# Patient Record
Sex: Male | Born: 1955 | Race: Black or African American | Hispanic: No | Marital: Single | Smoking: Never smoker
Health system: Southern US, Community
[De-identification: ages and names within clinical notes are randomized; demographics above are authoritative.]

## PROBLEM LIST (undated history)

## (undated) DIAGNOSIS — E785 Hyperlipidemia, unspecified: Secondary | ICD-10-CM

## (undated) DIAGNOSIS — I639 Cerebral infarction, unspecified: Secondary | ICD-10-CM

## (undated) DIAGNOSIS — I10 Essential (primary) hypertension: Secondary | ICD-10-CM

## (undated) HISTORY — PX: NO PAST SURGERIES: SHX2092

## (undated) HISTORY — PX: CORONARY ARTERY BYPASS GRAFT: SHX141

---

## 2009-06-15 ENCOUNTER — Ambulatory Visit: Payer: Self-pay | Admitting: Surgery

## 2009-06-22 ENCOUNTER — Encounter: Payer: Self-pay | Admitting: Surgery

## 2009-06-25 ENCOUNTER — Ambulatory Visit (HOSPITAL_COMMUNITY): Admission: RE | Admit: 2009-06-25 | Discharge: 2009-06-25 | Payer: Self-pay | Admitting: Interventional Radiology

## 2010-06-09 LAB — BASIC METABOLIC PANEL
BUN: 8 mg/dL (ref 6–23)
CO2: 24 mEq/L (ref 19–32)
Chloride: 106 mEq/L (ref 96–112)
Creatinine, Ser: 0.96 mg/dL (ref 0.4–1.5)
GFR calc Af Amer: >60 mL/min (ref >60)
Glucose, Bld: 92 mg/dL (ref 70–99)

## 2010-06-09 LAB — CBC
MCHC: 33.7 g/dL (ref 30.0–36.0)
WBC: 6.5 10*3/uL (ref 4.0–10.5)

## 2010-06-09 LAB — GLUCOSE, CAPILLARY: Glucose-Capillary: 99 mg/dL (ref 70–99)

## 2010-08-03 NOTE — Assessment & Plan Note (Signed)
OFFICE VISIT   KANAV, KAZMIERCZAK  DOB:  [birth year]                                       06/15/2009  CHART#:[id number]   REASON FOR VISIT:  Stroke.   HISTORY:  Patient is a 55 year old gentleman that I am seeing at the  request of Madison Hickman for evaluation of a recent stroke.  The  patient has a history of hypertension, hypercholesterolemia, and  diabetes, which are medically managed.  However, he has recently had  what he describes as multiple strokes.  He is left with slurred speech,  trouble with his nature and left-sided weakness.  He had been referred  to [facility] but has never made the trip.  He has a carotid ultrasound which  shows 1-39% bilateral carotid disease.  Transcranial Dopplers suggest  left MCA stenosis.  The MRI reveals posterior right cerebellar ischemic  infarcts.  He has undergone an echocardiogram which showed normal LV  function without mass thrombus or vegetation.  Normal ejection fraction  of 70%.   PAST MEDICAL HISTORY:  Cataracts, history of stroke and diabetes,  hypertension, hypercholesterolemia.   FAMILY HISTORY:  Negative for cardiovascular disease at an early age.   SOCIAL HISTORY:  He is disabled.  Drinks 1 quart of alcohol a week.   REVIEW OF SYSTEMS:  Positive for slurred speech and left side numbness.  All others negative, as documented in the encounter form.   PHYSICAL EXAMINATION:  Heart rate 78, blood pressure 152/80 temperature  97.6.  General:  Well-appearing in no distress.  HEENT:  Within normal  limits.  Lungs are clear.  Cardiovascular:  Regular rate and rhythm.  Abdomen:  Soft.  Musculoskeletal:  No major deformities.  Neuro:  He has  weakness on the left side of his body.  He has slurred speech, pressured  speech, and trouble with his memory.  Skin:  Without rash.   ASSESSMENT:  Status post cerebrovascular accident in the absence of  extracranial carotid disease with transcranial Dopplers suggesting  intracranial stenosis.   PLAN:  I spoke with Dr. Kerby Nora, and I am recommending that he  see him for cerebral angiography and possible treatment of his  intracranial carotid stenosis.  Dr. Corliss Skains will be contacting the  patient this week.  I stressed with the patient the importance of being  compliant with this meeting.     Jorge Ny, MD  Electronically Signed   VWB/MEDQ  D:  06/15/2009  T:  06/16/2009  Job:  [id number]   cc:   Darius Bump, M.D.  Vijaya B. Gandla  Dr. Kerby Nora

## 2011-07-07 ENCOUNTER — Telehealth (HOSPITAL_COMMUNITY): Payer: Self-pay

## 2011-07-07 NOTE — Telephone Encounter (Signed)
Called pt to set up F/U Angio with Dr. Corliss Skains.  Left VM

## 2011-07-13 ENCOUNTER — Other Ambulatory Visit (HOSPITAL_COMMUNITY): Payer: Self-pay | Admitting: Interventional Radiology

## 2011-07-13 ENCOUNTER — Telehealth (HOSPITAL_COMMUNITY): Payer: Self-pay

## 2011-07-13 DIAGNOSIS — I771 Stricture of artery: Secondary | ICD-10-CM

## 2011-07-13 NOTE — Telephone Encounter (Signed)
Called Fred Jenkins to schedule his F/U angio appt.  His mother stated that she will give him the message and have him call the office.  jlm 07-13-11

## 2011-07-27 ENCOUNTER — Other Ambulatory Visit: Payer: Self-pay | Admitting: Radiology

## 2011-07-27 ENCOUNTER — Encounter (HOSPITAL_COMMUNITY): Payer: Self-pay | Admitting: Pharmacy Technician

## 2011-08-02 ENCOUNTER — Encounter (HOSPITAL_COMMUNITY): Payer: Self-pay

## 2011-08-02 ENCOUNTER — Other Ambulatory Visit (HOSPITAL_COMMUNITY): Payer: Self-pay | Admitting: Interventional Radiology

## 2011-08-02 ENCOUNTER — Ambulatory Visit (HOSPITAL_COMMUNITY)
Admission: RE | Admit: 2011-08-02 | Discharge: 2011-08-02 | Disposition: A | Discharge status: Home / Self Care | Payer: Medicare Other | Source: Ambulatory Visit | Attending: Interventional Radiology | Admitting: Interventional Radiology

## 2011-08-02 DIAGNOSIS — I771 Stricture of artery: Secondary | ICD-10-CM

## 2011-08-02 DIAGNOSIS — I10 Essential (primary) hypertension: Secondary | ICD-10-CM | POA: Insufficient documentation

## 2011-08-02 DIAGNOSIS — I672 Cerebral atherosclerosis: Secondary | ICD-10-CM | POA: Insufficient documentation

## 2011-08-02 DIAGNOSIS — E119 Type 2 diabetes mellitus without complications: Secondary | ICD-10-CM | POA: Insufficient documentation

## 2011-08-02 HISTORY — DX: Cerebral infarction, unspecified: I63.9

## 2011-08-02 HISTORY — DX: Essential (primary) hypertension: I10

## 2011-08-02 HISTORY — DX: Hyperlipidemia, unspecified: E78.5

## 2011-08-02 LAB — BASIC METABOLIC PANEL
CO2: 27 mEq/L (ref 19–32)
Chloride: 103 mEq/L (ref 96–112)
Creatinine, Ser: 0.9 mg/dL (ref 0.50–1.35)
GFR calc Af Amer: >90 mL/min (ref >90)
Potassium: 4.4 mEq/L (ref 3.5–5.1)
Sodium: 139 mEq/L (ref 135–145)

## 2011-08-02 LAB — DIFFERENTIAL
Basophils Relative: 0 % (ref 0–1)
Eosinophils Absolute: 0.2 10*3/uL (ref 0.0–0.7)
Lymphs Abs: 1.5 10*3/uL (ref 0.7–4.0)
Monocytes Absolute: 0.3 10*3/uL (ref 0.1–1.0)
Neutrophils Relative %: 58 % (ref 43–77)

## 2011-08-02 LAB — CBC
HCT: 38.2 % — ABNORMAL LOW (ref 39.0–52.0)
Hemoglobin: 13.4 g/dL (ref 13.0–17.0)
MCH: 25.4 pg — ABNORMAL LOW (ref 26.0–34.0)
MCHC: 35.1 g/dL (ref 30.0–36.0)
MCV: 72.5 fL — ABNORMAL LOW (ref 78.0–100.0)
Platelets: 134 10*3/uL — ABNORMAL LOW (ref 150–400)
RBC: 5.27 MIL/uL (ref 4.22–5.81)
WBC: 4.8 10*3/uL (ref 4.0–10.5)

## 2011-08-02 LAB — PROTIME-INR
INR: 0.94 (ref 0.00–1.49)
Prothrombin Time: 12.8 seconds (ref 11.6–15.2)

## 2011-08-02 LAB — APTT: aPTT: 26 seconds (ref 24–37)

## 2011-08-02 MED ORDER — SODIUM CHLORIDE 0.9 % IV SOLN
INTRAVENOUS | Status: AC | PRN
  Administered 2011-08-02: 75 mL/h via INTRAVENOUS

## 2011-08-02 MED ORDER — IOHEXOL 300 MG/ML  SOLN
150.0000 mL | Freq: Once | INTRAMUSCULAR | Status: AC | PRN
  Administered 2011-08-02: 70 mL via INTRA_ARTERIAL

## 2011-08-02 MED ORDER — HYDRALAZINE HCL 20 MG/ML IJ SOLN
INTRAMUSCULAR | Status: AC | PRN
  Administered 2011-08-02 (×3): 5 mg via INTRAVENOUS

## 2011-08-02 MED ORDER — HEPARIN SOD (PORK) LOCK FLUSH 100 UNIT/ML IV SOLN
INTRAVENOUS | Status: AC | PRN
  Administered 2011-08-02 (×2): 500 [IU] via INTRAVENOUS

## 2011-08-02 MED ORDER — SODIUM CHLORIDE 0.9 % IV SOLN: Freq: Once | INTRAVENOUS | Status: DC

## 2011-08-02 MED ORDER — HYDRALAZINE HCL 20 MG/ML IJ SOLN
INTRAMUSCULAR | Status: AC
  Filled 2011-08-02: qty 1

## 2011-08-02 MED ORDER — MIDAZOLAM HCL 2 MG/2ML IJ SOLN
INTRAMUSCULAR | Status: AC
  Filled 2011-08-02: qty 2

## 2011-08-02 MED ORDER — SODIUM CHLORIDE 0.9 % IV SOLN: INTRAVENOUS | Status: AC

## 2011-08-02 MED ORDER — MIDAZOLAM HCL 5 MG/5ML IJ SOLN
INTRAMUSCULAR | Status: AC | PRN
  Administered 2011-08-02: 1 mg via INTRAVENOUS

## 2011-08-02 MED ORDER — FENTANYL CITRATE 0.05 MG/ML IJ SOLN
INTRAMUSCULAR | Status: AC | PRN
  Administered 2011-08-02: 50 ug via INTRAVENOUS

## 2011-08-02 MED ORDER — FENTANYL CITRATE 0.05 MG/ML IJ SOLN
INTRAMUSCULAR | Status: AC
  Filled 2011-08-02: qty 2

## 2011-08-02 NOTE — ED Notes (Signed)
O2 d/c'd 

## 2011-08-02 NOTE — ED Notes (Signed)
Pedal pulses good, R groin dsg clean, dry, intact; denies pain

## 2011-08-02 NOTE — ED Notes (Signed)
Sheath pulled.  Exoseal closure used.

## 2011-08-02 NOTE — Procedures (Signed)
S/P bilateral CCarteriogram,and Lt vert arteriogram Preliminary findings  1.App 60 % stenosis  Lt MCA prox,,and 25% to 50% stenosis rt MCA

## 2011-08-02 NOTE — Discharge Instructions (Signed)
Arteriogram Care After These instructions give you information on caring for yourself after your procedure. Your doctor may also give you more specific instructions. Call your doctor if you have any problems or questions after your procedure. HOME CARE  Stay in bed the rest of the day.   Keep your leg straight for at least 6 hours.   Do not lift anything heavier than 10 pounds (about a gallon of milk) for 2 days.   Do not walk a lot, run, or drive for 2 days.   Return to normal activities in 2 days or as told by your doctor.  Finding out the results of your test Ask when your test results will be ready. Make sure you get your test results. GET HELP RIGHT AWAY IF:   You have fever of 102 F (38.9 C) or higher.   You have more pain in your leg.   The leg that was cut is:   Bleeding.   Puffy (swollen) or red.   Cold.   Pale or changes color.   Weak.   Tingly or numb.  If you go to the Emergency Room, tell your nurse that you have had an arteriogram. Take this paper with you to show the nurse. MAKE SURE YOU:  Understand these instructions.   Will watch your condition.   Will get help right away if you are not doing well or get worse.  Document Released: 06/03/2008 Document Revised: 02/24/2011 Document Reviewed: 06/03/2008 [employer] Patient Information 2012 [employer]. 

## 2011-08-02 NOTE — H&P (Signed)
Chief Complaint: (L)MCA stenosis HPI: Fred Jenkins is an 56 y.o. male with a known history of (L)MCA stenosis. He also has known moderate 50% ICA stenosis bilaterally. He denies any recent amaurosis, TIA, CVA sxs and states he has been doing well. Last angiogram was 06/2009. He is on medicine for HTN and DM but states he might only take his meds 2-3x/week. He is not smoking. Plavix is not listed in his current med list, but he claims to be taking it. IT was noted on his med list in 2011.  He is scheduled today for follow up cerebral angiogram.  Past Medical History:  Past Medical History  Diagnosis Date  . Hypertension   . Diabetes mellitus   . Hyperlipidemia   . Stroke     Past Surgical History:  Past Surgical History  Procedure Date  . No past surgeries     Family History: History reviewed. No pertinent family history.  Social History:  reports that he has never smoked. He does not have any smokeless tobacco history on file. He reports that he does not drink alcohol or use illicit drugs.  Allergies: No Known Allergies  Medications: Norvasc 10 daily ASA 81mg  daily Lipitor 20mg  daily Pepcid 20mg  daily Glucophage 500mg  bid Pravachol 80mg  daily **Plavix 75mg  dfaily??**  Please HPI for pertinent positives, otherwise complete 10 system ROS negative.  Pulse 56, temperature 97.1 F (36.2 C), temperature source Oral, resp. rate 18, height 5\' 8"  (1.727 m), weight 187 lb (84.823 kg), SpO2 98.00%. Body mass index is 28.43 kg/(m^2).   General Appearance:  Alert, cooperative, no distress, appears stated age  Head:  Normocephalic, without obvious abnormality, atraumatic  ENT: Unremarkable  Neck: Supple, symmetrical, trachea midline, no adenopathy, thyroid: not enlarged, symmetric, no tenderness/mass/nodules  Lungs:   Clear to auscultation bilaterally, no w/r/r, respirations unlabored without use of accessory muscles.  Heart:  Regular rate and rhythm, S1, S2 normal, no murmur, rub or  gallop. Carotids 2+ without bruit.  Abdomen:   Soft, non-tender, non distended. Bowel sounds active all four quadrants,  no masses, no organomegaly.  Extremities: Extremities normal, atraumatic, no cyanosis or edema  Pulses: 2+ and symmetric  Neurologic: Normal affect, no gross deficits.   Results for orders placed during the hospital encounter of 08/02/11 (from the past 48 hour(s))  APTT     Status: Normal   Collection Time   08/02/11  7:45 AM      Component Value Range Comment   aPTT 26  24 - 37 (seconds)   BASIC METABOLIC PANEL     Status: Abnormal   Collection Time   08/02/11  7:45 AM      Component Value Range Comment   Sodium 139  135 - 145 (mEq/L)    Potassium 4.4  3.5 - 5.1 (mEq/L)    Chloride 103  96 - 112 (mEq/L)    CO2 27  19 - 32 (mEq/L)    Glucose, Bld 166 (*) 70 - 99 (mg/dL)    BUN 12  6 - 23 (mg/dL)    Creatinine, Ser 5.62  0.50 - 1.35 (mg/dL)    Calcium 9.3  8.4 - 10.5 (mg/dL)    GFR calc non Af Amer >90  >90 (mL/min)    GFR calc Af Amer >90  >90 (mL/min)   CBC     Status: Abnormal   Collection Time   08/02/11  7:45 AM      Component Value Range Comment   WBC 4.8  4.0 -  10.5 (K/uL)    RBC 5.27  4.22 - 5.81 (MIL/uL)    Hemoglobin 13.4  13.0 - 17.0 (g/dL)    HCT 16.1 (*) 09.6 - 52.0 (%)    MCV 72.5 (*) 78.0 - 100.0 (fL)    MCH 25.4 (*) 26.0 - 34.0 (pg)    MCHC 35.1  30.0 - 36.0 (g/dL)    RDW 04.5  40.9 - 81.1 (%)    Platelets 134 (*) 150 - 400 (K/uL)   DIFFERENTIAL     Status: Normal (Preliminary result)   Collection Time   08/02/11  7:45 AM      Component Value Range Comment   Neutrophils Relative PENDING  43 - 77 (%)    Neutro Abs PENDING  1.7 - 7.7 (K/uL)    Band Neutrophils PENDING  0 - 10 (%)    Lymphocytes Relative PENDING  12 - 46 (%)    Lymphs Abs PENDING  0.7 - 4.0 (K/uL)    Monocytes Relative PENDING  3 - 12 (%)    Monocytes Absolute PENDING  0.1 - 1.0 (K/uL)    Eosinophils Relative PENDING  0 - 5 (%)    Eosinophils Absolute PENDING  0.0 - 0.7  (K/uL)    Basophils Relative PENDING  0 - 1 (%)    Basophils Absolute PENDING  0.0 - 0.1 (K/uL)    WBC Morphology PENDING      RBC Morphology PENDING      Smear Review PENDING      nRBC PENDING  0 (/100 WBC)    Metamyelocytes Relative PENDING      Myelocytes PENDING      Promyelocytes Absolute PENDING      Blasts PENDING     PROTIME-INR     Status: Normal   Collection Time   08/02/11  7:45 AM      Component Value Range Comment   Prothrombin Time 12.8  11.6 - 15.2 (seconds)    INR 0.94  0.00 - 1.49    GLUCOSE, CAPILLARY     Status: Abnormal   Collection Time   08/02/11  8:22 AM      Component Value Range Comment   Glucose-Capillary 151 (*) 70 - 99 (mg/dL)    No results found.  Assessment/Plan (L)MCA stenosis-50% (B)ICA stenosis-50% For repeat angiogram today. Procedure discussed including risks and complications. Consent signed in chart.  Brayton El PA-C 08/02/2011, 8:43 AM

## 2011-08-02 NOTE — ED Notes (Signed)
O2 2L/Ingram started 

## 2011-08-02 NOTE — ED Notes (Signed)
Right groin and pulse stable 

## 2012-08-06 ENCOUNTER — Telehealth (HOSPITAL_COMMUNITY): Payer: Self-pay | Admitting: Interventional Radiology

## 2012-08-09 ENCOUNTER — Other Ambulatory Visit (HOSPITAL_COMMUNITY): Payer: Self-pay | Admitting: Interventional Radiology

## 2012-08-09 DIAGNOSIS — I771 Stricture of artery: Secondary | ICD-10-CM

## 2012-08-21 ENCOUNTER — Other Ambulatory Visit: Payer: Self-pay | Admitting: Radiology

## 2012-08-28 ENCOUNTER — Ambulatory Visit (HOSPITAL_COMMUNITY)
Admission: RE | Admit: 2012-08-28 | Discharge: 2012-08-28 | Disposition: A | Discharge status: Home / Self Care | Payer: Medicare Other | Source: Ambulatory Visit | Attending: Interventional Radiology | Admitting: Interventional Radiology

## 2012-08-28 ENCOUNTER — Other Ambulatory Visit (HOSPITAL_COMMUNITY): Payer: Self-pay | Admitting: Interventional Radiology

## 2012-08-28 ENCOUNTER — Encounter (HOSPITAL_COMMUNITY): Payer: Self-pay | Admitting: Pharmacy Technician

## 2012-08-28 ENCOUNTER — Encounter (HOSPITAL_COMMUNITY): Payer: Self-pay

## 2012-08-28 DIAGNOSIS — I771 Stricture of artery: Secondary | ICD-10-CM

## 2012-08-28 DIAGNOSIS — E119 Type 2 diabetes mellitus without complications: Secondary | ICD-10-CM | POA: Insufficient documentation

## 2012-08-28 DIAGNOSIS — I672 Cerebral atherosclerosis: Secondary | ICD-10-CM | POA: Insufficient documentation

## 2012-08-28 DIAGNOSIS — Z8673 Personal history of transient ischemic attack (TIA), and cerebral infarction without residual deficits: Secondary | ICD-10-CM | POA: Insufficient documentation

## 2012-08-28 DIAGNOSIS — I10 Essential (primary) hypertension: Secondary | ICD-10-CM | POA: Insufficient documentation

## 2012-08-28 DIAGNOSIS — I2510 Atherosclerotic heart disease of native coronary artery without angina pectoris: Secondary | ICD-10-CM | POA: Insufficient documentation

## 2012-08-28 DIAGNOSIS — Z7902 Long term (current) use of antithrombotics/antiplatelets: Secondary | ICD-10-CM | POA: Insufficient documentation

## 2012-08-28 DIAGNOSIS — E785 Hyperlipidemia, unspecified: Secondary | ICD-10-CM | POA: Insufficient documentation

## 2012-08-28 DIAGNOSIS — Z951 Presence of aortocoronary bypass graft: Secondary | ICD-10-CM | POA: Insufficient documentation

## 2012-08-28 DIAGNOSIS — Z7982 Long term (current) use of aspirin: Secondary | ICD-10-CM | POA: Insufficient documentation

## 2012-08-28 LAB — BASIC METABOLIC PANEL
Chloride: 101 mEq/L (ref 96–112)
GFR calc Af Amer: >90 mL/min (ref >90)
GFR calc non Af Amer: 78 mL/min — ABNORMAL LOW (ref >90)
Glucose, Bld: 188 mg/dL — ABNORMAL HIGH (ref 70–99)
Potassium: 4.1 mEq/L (ref 3.5–5.1)
Sodium: 135 mEq/L (ref 135–145)

## 2012-08-28 LAB — GLUCOSE, CAPILLARY: Glucose-Capillary: 177 mg/dL — ABNORMAL HIGH (ref 70–99)

## 2012-08-28 LAB — CBC WITH DIFFERENTIAL/PLATELET
Basophils Relative: 0 % (ref 0–1)
Eosinophils Relative: 6 % — ABNORMAL HIGH (ref 0–5)
Hemoglobin: 12.2 g/dL — ABNORMAL LOW (ref 13.0–17.0)
Lymphocytes Relative: 30 % (ref 12–46)
Neutrophils Relative %: 56 % (ref 43–77)
RBC: 5.06 MIL/uL (ref 4.22–5.81)

## 2012-08-28 LAB — PROTIME-INR
INR: 0.96 (ref 0.00–1.49)
Prothrombin Time: 12.7 seconds (ref 11.6–15.2)

## 2012-08-28 MED ORDER — FENTANYL CITRATE 0.05 MG/ML IJ SOLN
INTRAMUSCULAR | Status: AC
  Filled 2012-08-28: qty 2

## 2012-08-28 MED ORDER — SODIUM CHLORIDE 0.9 % IV SOLN: INTRAVENOUS | Status: AC

## 2012-08-28 MED ORDER — MIDAZOLAM HCL 2 MG/2ML IJ SOLN
INTRAMUSCULAR | Status: DC | PRN
  Administered 2012-08-28: 1 mg via INTRAVENOUS

## 2012-08-28 MED ORDER — IOHEXOL 300 MG/ML  SOLN
150.0000 mL | Freq: Once | INTRAMUSCULAR | Status: AC | PRN
  Administered 2012-08-28: 70 mL via INTRA_ARTERIAL

## 2012-08-28 MED ORDER — SODIUM CHLORIDE 0.9 % IV SOLN
Freq: Once | INTRAVENOUS | Status: AC
  Administered 2012-08-28: 07:00:00 via INTRAVENOUS

## 2012-08-28 MED ORDER — MIDAZOLAM HCL 2 MG/2ML IJ SOLN
INTRAMUSCULAR | Status: AC
  Filled 2012-08-28: qty 2

## 2012-08-28 MED ORDER — HEPARIN SOD (PORK) LOCK FLUSH 100 UNIT/ML IV SOLN
INTRAVENOUS | Status: DC | PRN
  Administered 2012-08-28: 500 [IU] via INTRAVENOUS

## 2012-08-28 MED ORDER — FENTANYL CITRATE 0.05 MG/ML IJ SOLN
INTRAMUSCULAR | Status: DC | PRN
  Administered 2012-08-28: 25 ug via INTRAVENOUS

## 2012-08-28 NOTE — Progress Notes (Signed)
Pt and pts mother are confused about which medications pt needs to take.  Pts mother pulled out medications in a bag that were not on pts list.  Pharmacy consult called.  Pharmacy tech came up and reviewed all of pts medications and generated a new list.  Correct list given to pt upon discharge with written instructions not to resume metformin until 08-31-12.  Pt and pts mother verbalize understanding.

## 2012-08-28 NOTE — H&P (Signed)
Fred Jenkins is an 57 y.o. male.   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 Hypertension   . Diabetes mellitus   . Hyperlipidemia   . Stroke     Past Surgical History  Procedure Laterality Date  . No past surgeries      No family history on file. Social History:  reports that he has never smoked. He does not have any smokeless tobacco history on file. He reports that he does not drink alcohol or use illicit drugs.  Allergies: No Known Allergies   (Not in a hospital admission)  Results for orders placed during the hospital encounter of 08/28/12 (from the past 48 hour(s))  GLUCOSE, CAPILLARY     Status: Abnormal   Collection Time    08/28/12  7:07 AM      Result Value Range   Glucose-Capillary 177 (*) 70 - 99 mg/dL   No results found.  Review of Systems  Constitutional: Negative for fever.  HENT: Negative for hearing loss and neck pain.   Eyes: Negative for blurred vision and double vision.  Respiratory: Negative for cough and shortness of breath.   Cardiovascular: Negative for chest pain.  Gastrointestinal: Negative for nausea, vomiting and abdominal pain.  Musculoskeletal: Negative for back pain.  Neurological: Positive for weakness. Negative for dizziness, tingling and headaches.  Psychiatric/Behavioral: Negative for hallucinations and substance abuse.    Blood pressure 146/91, pulse 70, temperature 97 F (36.1 C), temperature source Oral, resp. rate 18, height 5\' 8"  (1.727 m), weight 180 lb (81.647 kg), SpO2 97.00%. Physical Exam  Constitutional: He is oriented to person, place, and time. He appears well-developed and well-nourished.  Cardiovascular: Normal rate, regular rhythm and normal heart sounds.   No murmur  heard. Respiratory: Effort normal and breath sounds normal. He has no wheezes.  GI: Soft. Bowel sounds are normal. There is no tenderness.  Musculoskeletal: Normal range of motion.  Gait is sl unsteady per pt; does not use walker or cane  Neurological: He is alert and oriented to person, place, and time. No cranial nerve deficit.  Skin: Skin is warm.  Psychiatric: He has a normal mood and affect. His behavior is normal. Judgment and thought content normal.     Assessment/Plan CVAs Known L MCA stenosis- asymptomatic-- using ASA/Plavix Scheduled now for re check cerebral arteriogram Pt and mother aware of procedure benefits and risks and agreeable to proceed Consent signed and in chart  Latisia Hilaire A 08/28/2012, 7:26 AM

## 2012-08-28 NOTE — Procedures (Signed)
S/P bilateral common carotid arteriograms,and Lt vertebral arteriogram. Rt cfa APPROACH.  Findings . Marland Kitchen1.approx 80 %stenosis RT MCA prox.

## 2013-04-30 ENCOUNTER — Other Ambulatory Visit (HOSPITAL_COMMUNITY): Payer: Self-pay | Admitting: Interventional Radiology

## 2013-04-30 DIAGNOSIS — I639 Cerebral infarction, unspecified: Secondary | ICD-10-CM

## 2013-04-30 DIAGNOSIS — I771 Stricture of artery: Secondary | ICD-10-CM

## 2013-05-13 ENCOUNTER — Other Ambulatory Visit (HOSPITAL_COMMUNITY): Payer: Self-pay | Admitting: Interventional Radiology

## 2013-05-13 ENCOUNTER — Telehealth (HOSPITAL_COMMUNITY): Payer: Self-pay | Admitting: Interventional Radiology

## 2013-05-13 DIAGNOSIS — I639 Cerebral infarction, unspecified: Secondary | ICD-10-CM

## 2013-05-13 DIAGNOSIS — I771 Stricture of artery: Secondary | ICD-10-CM

## 2013-05-13 NOTE — Telephone Encounter (Signed)
Called pt left VM for him to call me to reschedule his MRI appt on 05/21/13 JM

## 2013-05-20 IMAGING — XA IR ANGIO INTRA EXTRACRAN SEL COM CAROTID INNOMINATE BILAT MOD SE
1 series · 13 of 24 positions shown · IV contrast (IODINE)
Comparison: Angiogram of 06/25/2009.

CLINICAL DATA: History of right-sided numbness.  Previous history
of intracranial artery stenosis.  Diabetes mellitus.  Hypertension.

BILATERAL COMMON CAROTID ARTERIOGRAMS AND LEFT VERTEBRAL ARTERY
ANGIOGRAM

[Series 300: neuro · 13 of 123 slices shown]
[im 1/123]
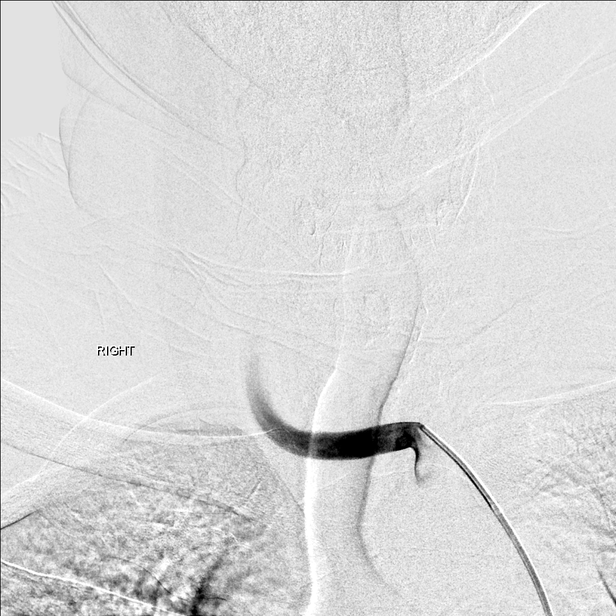
[im 11/123]
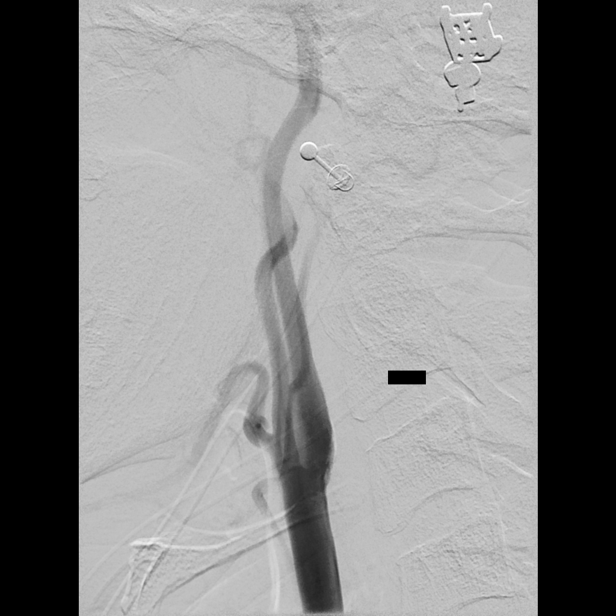
[im 22/123]
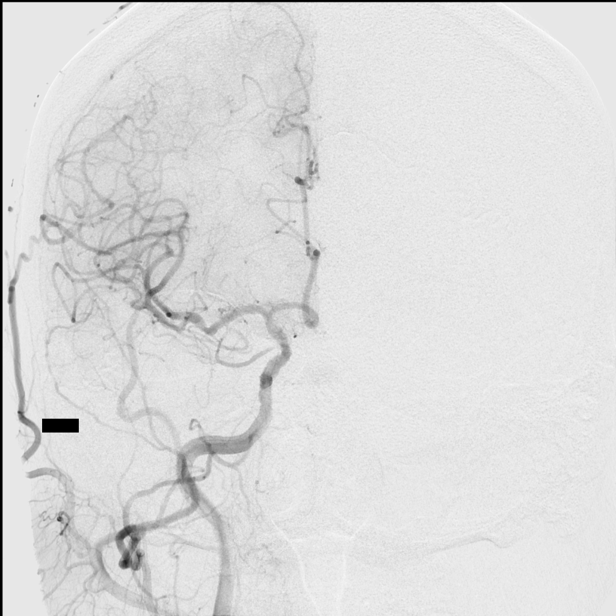
[im 32/123]
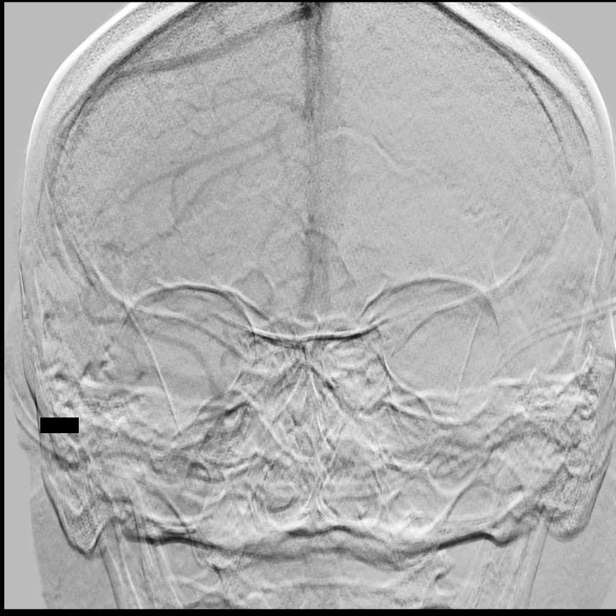
[im 43/123]
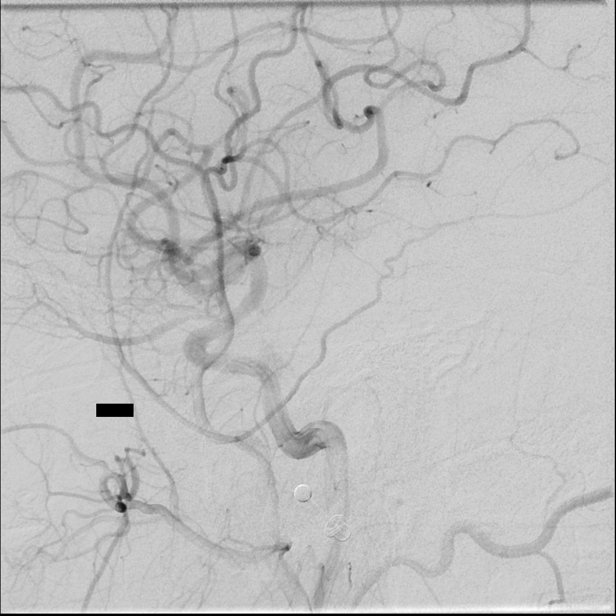
[im 54/123]
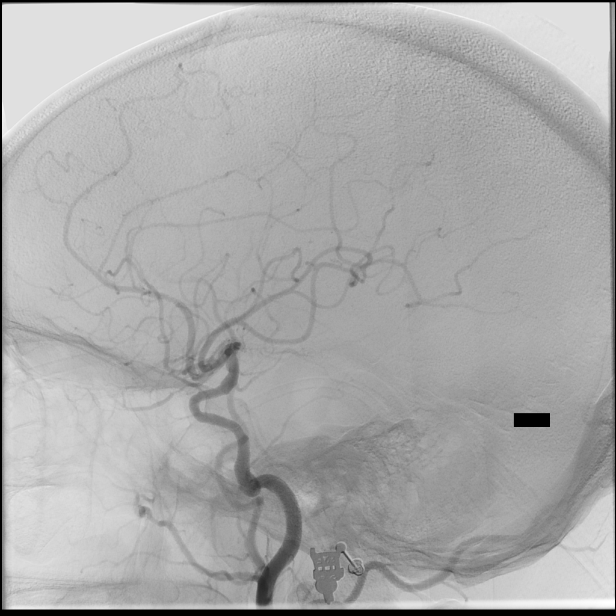
[im 64/123]
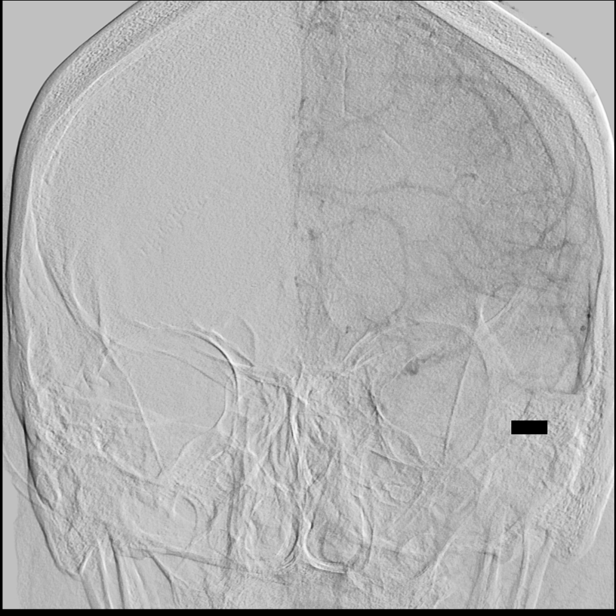
[im 69/123]
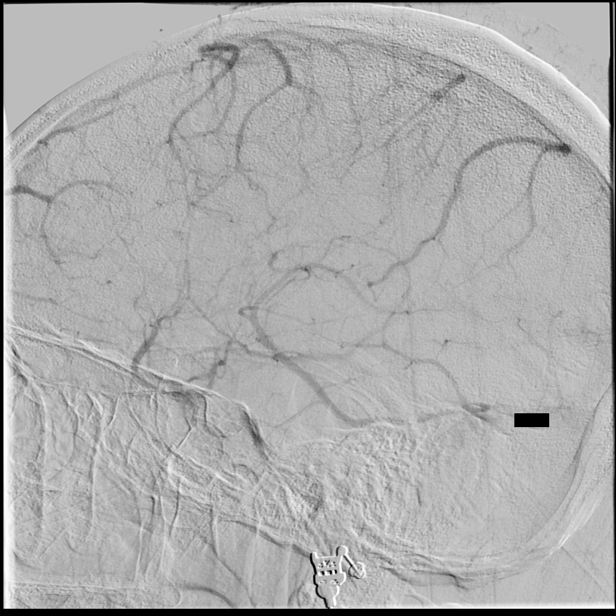
[im 80/123]
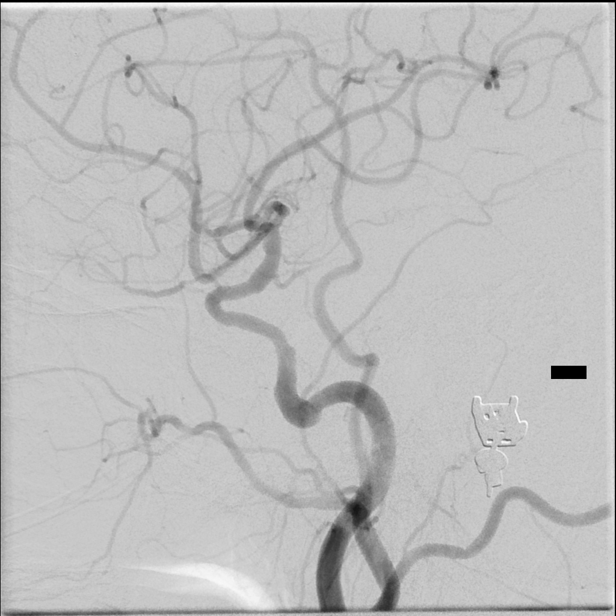
[im 91/123]
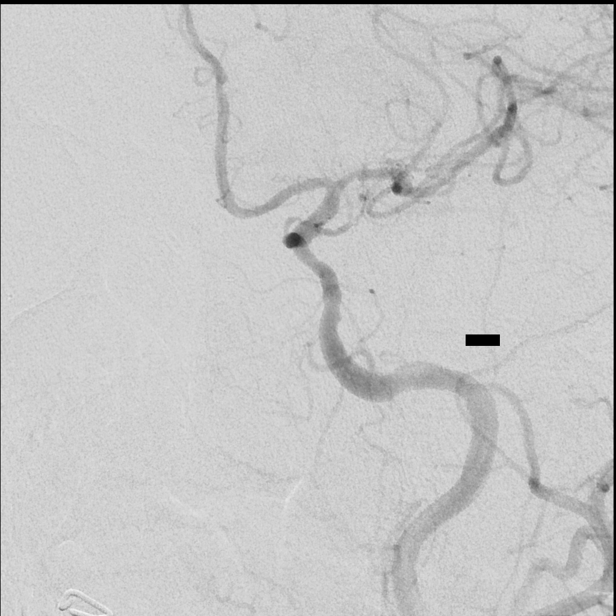
[im 101/123]
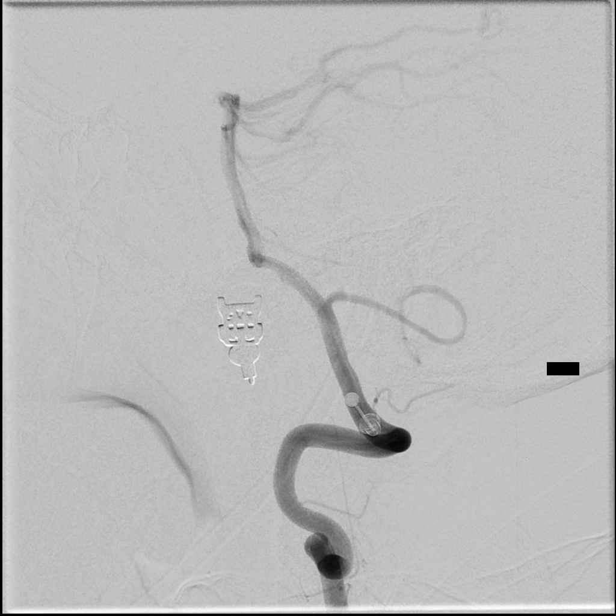
[im 112/123]
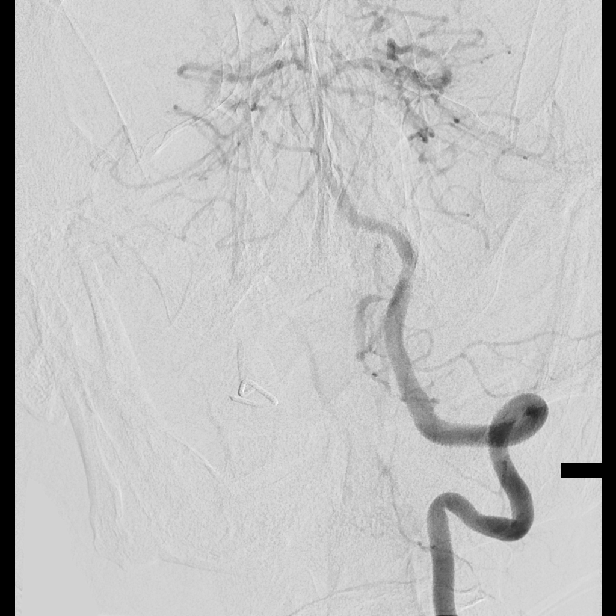
[im 123/123]
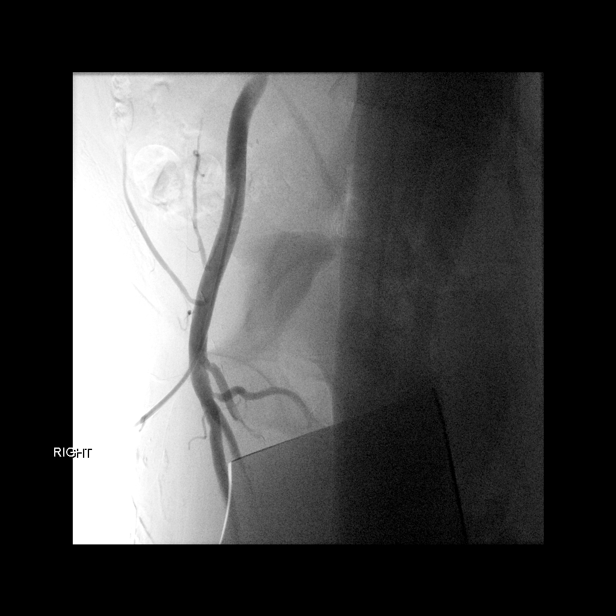

[13 of 24 positions shown; findings below may reference images not displayed]

Following a full explanation of the procedure along with the
potential associated complications, an informed witnessed consent
was obtained.

The right groin was prepped and draped in the usual sterile
fashion.  Thereafter using modified Seldinger technique,
transfemoral access into the right common femoral artery was
obtained without difficulty.  Over a 0.035-inch guidewire, a 5-
French Pinnacle sheath was inserted.  Through this and also over a
0.035-inch guidewire, a 5-French JB1 catheter was advanced to the
aortic arch region and selectively positioned in the right common
carotid artery, the left common carotid artery and the left
vertebral artery.

There were no acute complications.  The patient tolerated the
procedure well.

Medications utilized: Versed 1 mg IV.  Fentanyl 25 mcg IV.
Apresoline 15 mg IV.

Contrast: Omnipaque 300 approximately 55 ml.
FINDINGS: The right common carotid arteriogram demonstrates the right
external carotid artery and its major branches to be normal.

The right internal carotid artery at the bulb to the cranial skull
base is also normal.

The petrous and the cavernous segments are normally opacified.

There is mild to moderate circumferential narrowing of the right
internal carotid supraclinoid segment.

The right middle cerebral artery in its mid M1 segment demonstrates
50% stenosis.  The bifurcation branches otherwise are normal.

The right anterior cerebral artery opacifies normally into the
capillary and venous phases.

The left common carotid arteriogram demonstrates the left external
carotid artery and its major branches to be normal.

The left internal carotid artery at the bulb to the cranial skull
base opacifies normally.

The petrous segment is normal.

There is diffuse narrowing of the cavernous segment of the left
internal carotid artery.

The supraclinoid segment appears grossly patent.

The left middle cerebral artery again demonstrates approximately
60% to 70% stenosis.

The left MCA trifurcation branches demonstrate mild focal areas of
caliber irregularity proximally.

The left anterior cerebral artery opacifies normally into the
capillary and venous phases.

The left vertebral artery origin is normal.  The vessel opacifies
normally to the cranial skull base.

There is normal opacification of the left posterior-inferior
cerebellar artery and the left vertebrobasilar junction.

The basilar artery, the posterior cerebral arteries, the superior
cerebellar arteries and the anterior-inferior cerebellar arteries
opacify normally into capillary and venous phases.

IMPRESSION
1.  Interval suggestion of progression of the left middle cerebral
artery stenoses to 60-70% stenosis.
2.  Stable-appearing stenosis of the right middle cerebral artery
approximately 50%.
3.  Mild diffuse atherosclerotic narrowing of the left internal
carotid artery cavernous segment, and of the right internal carotid
artery supraclinoid segment, also stable.

The angiograph findings were reviewed with the patient.

He was again strongly advised to continue taking his aspirin, his
antihypertensives, and to control his blood sugars.  He will
undergo a repeat catheter angiogram in a year from today or sooner
should he develop symptoms.

## 2013-05-21 ENCOUNTER — Ambulatory Visit (HOSPITAL_COMMUNITY): Admission: RE | Admit: 2013-05-21 | Payer: Medicare Other | Source: Ambulatory Visit

## 2013-06-04 ENCOUNTER — Ambulatory Visit (HOSPITAL_COMMUNITY)
Admission: RE | Admit: 2013-06-04 | Discharge: 2013-06-04 | Disposition: A | Discharge status: Home / Self Care | Payer: Medicare Other | Source: Ambulatory Visit | Attending: Interventional Radiology | Admitting: Interventional Radiology

## 2013-06-04 DIAGNOSIS — I6529 Occlusion and stenosis of unspecified carotid artery: Secondary | ICD-10-CM | POA: Insufficient documentation

## 2013-06-04 DIAGNOSIS — I771 Stricture of artery: Secondary | ICD-10-CM

## 2013-06-04 DIAGNOSIS — G9389 Other specified disorders of brain: Secondary | ICD-10-CM | POA: Insufficient documentation

## 2013-06-04 DIAGNOSIS — I639 Cerebral infarction, unspecified: Secondary | ICD-10-CM

## 2013-06-04 LAB — CREATININE, SERUM: Creatinine, Ser: 0.64 mg/dL (ref 0.50–1.35)

## 2013-06-04 MED ORDER — GADOBENATE DIMEGLUMINE 529 MG/ML IV SOLN
20.0000 mL | Freq: Once | INTRAVENOUS | Status: AC
  Administered 2013-06-04: 17 mL via INTRAVENOUS

## 2013-06-10 ENCOUNTER — Telehealth (HOSPITAL_COMMUNITY): Payer: Self-pay | Admitting: Interventional Radiology

## 2013-06-10 NOTE — Telephone Encounter (Signed)
Called pt, left VM for him to call me to schedule angio JM

## 2013-06-26 ENCOUNTER — Telehealth (HOSPITAL_COMMUNITY): Payer: Self-pay | Admitting: Interventional Radiology

## 2013-06-26 ENCOUNTER — Other Ambulatory Visit (HOSPITAL_COMMUNITY): Payer: Self-pay | Admitting: Interventional Radiology

## 2013-06-26 DIAGNOSIS — I6529 Occlusion and stenosis of unspecified carotid artery: Secondary | ICD-10-CM

## 2013-06-26 DIAGNOSIS — I639 Cerebral infarction, unspecified: Secondary | ICD-10-CM

## 2013-06-26 NOTE — Telephone Encounter (Signed)
Called pt's mother, left VM for her to call to schedule angio for [name]

## 2013-07-12 ENCOUNTER — Telehealth (HOSPITAL_COMMUNITY): Payer: Self-pay | Admitting: Interventional Radiology

## 2013-07-12 NOTE — Telephone Encounter (Signed)
Called pt's mother, left VM for her to call to schedule pt's appt JMichaux

## 2013-07-17 ENCOUNTER — Telehealth (HOSPITAL_COMMUNITY): Payer: Self-pay | Admitting: Interventional Radiology

## 2013-07-17 NOTE — Telephone Encounter (Signed)
Called pt's mother, left VM for her to call and schedule pt's f/u cath angio JM

## 2013-07-18 ENCOUNTER — Other Ambulatory Visit: Payer: Self-pay | Admitting: Radiology

## 2013-07-18 ENCOUNTER — Encounter (HOSPITAL_COMMUNITY): Payer: Self-pay | Admitting: Pharmacy Technician

## 2013-07-23 ENCOUNTER — Encounter (HOSPITAL_COMMUNITY): Payer: Self-pay

## 2013-07-23 ENCOUNTER — Other Ambulatory Visit (HOSPITAL_COMMUNITY): Payer: Self-pay | Admitting: Interventional Radiology

## 2013-07-23 ENCOUNTER — Ambulatory Visit (HOSPITAL_COMMUNITY)
Admission: RE | Admit: 2013-07-23 | Discharge: 2013-07-23 | Disposition: A | Discharge status: Home / Self Care | Payer: Medicare Other | Source: Ambulatory Visit | Attending: Interventional Radiology | Admitting: Interventional Radiology

## 2013-07-23 DIAGNOSIS — Z7982 Long term (current) use of aspirin: Secondary | ICD-10-CM | POA: Insufficient documentation

## 2013-07-23 DIAGNOSIS — I672 Cerebral atherosclerosis: Secondary | ICD-10-CM | POA: Insufficient documentation

## 2013-07-23 DIAGNOSIS — I6529 Occlusion and stenosis of unspecified carotid artery: Secondary | ICD-10-CM

## 2013-07-23 DIAGNOSIS — E119 Type 2 diabetes mellitus without complications: Secondary | ICD-10-CM | POA: Insufficient documentation

## 2013-07-23 DIAGNOSIS — I10 Essential (primary) hypertension: Secondary | ICD-10-CM | POA: Insufficient documentation

## 2013-07-23 DIAGNOSIS — I639 Cerebral infarction, unspecified: Secondary | ICD-10-CM

## 2013-07-23 DIAGNOSIS — Z7902 Long term (current) use of antithrombotics/antiplatelets: Secondary | ICD-10-CM | POA: Insufficient documentation

## 2013-07-23 DIAGNOSIS — E785 Hyperlipidemia, unspecified: Secondary | ICD-10-CM | POA: Insufficient documentation

## 2013-07-23 DIAGNOSIS — Z8673 Personal history of transient ischemic attack (TIA), and cerebral infarction without residual deficits: Secondary | ICD-10-CM | POA: Insufficient documentation

## 2013-07-23 LAB — BASIC METABOLIC PANEL
BUN: 7 mg/dL (ref 6–23)
CALCIUM: 9.3 mg/dL (ref 8.4–10.5)
CO2: 28 mEq/L (ref 19–32)
Chloride: 103 mEq/L (ref 96–112)
Creatinine, Ser: 0.79 mg/dL (ref 0.50–1.35)
GFR calc Af Amer: >90 mL/min (ref >90)
Glucose, Bld: 100 mg/dL — ABNORMAL HIGH (ref 70–99)
POTASSIUM: 4.7 meq/L (ref 3.7–5.3)
SODIUM: 142 meq/L (ref 137–147)

## 2013-07-23 LAB — CBC WITH DIFFERENTIAL/PLATELET
Basophils Absolute: 0.1 10*3/uL (ref 0.0–0.1)
Basophils Relative: 1 % (ref 0–1)
EOS ABS: 0.4 10*3/uL (ref 0.0–0.7)
Eosinophils Relative: 7 % — ABNORMAL HIGH (ref 0–5)
HCT: 36.7 % — ABNORMAL LOW (ref 39.0–52.0)
Hemoglobin: 12.3 g/dL — ABNORMAL LOW (ref 13.0–17.0)
Lymphocytes Relative: 39 % (ref 12–46)
Lymphs Abs: 2.1 10*3/uL (ref 0.7–4.0)
MCH: 23.1 pg — ABNORMAL LOW (ref 26.0–34.0)
MCHC: 33.5 g/dL (ref 30.0–36.0)
MCV: 69 fL — ABNORMAL LOW (ref 78.0–100.0)
MONO ABS: 0.4 10*3/uL (ref 0.1–1.0)
Monocytes Relative: 7 % (ref 3–12)
NEUTROS PCT: 46 % (ref 43–77)
Neutro Abs: 2.5 10*3/uL (ref 1.7–7.7)
PLATELETS: 175 10*3/uL (ref 150–400)
RBC: 5.32 MIL/uL (ref 4.22–5.81)
RDW: 17.2 % — ABNORMAL HIGH (ref 11.5–15.5)
WBC: 5.5 10*3/uL (ref 4.0–10.5)

## 2013-07-23 LAB — PROTIME-INR
INR: 0.95 (ref 0.00–1.49)
Prothrombin Time: 12.5 seconds (ref 11.6–15.2)

## 2013-07-23 LAB — GLUCOSE, CAPILLARY: GLUCOSE-CAPILLARY: 82 mg/dL (ref 70–99)

## 2013-07-23 LAB — APTT: aPTT: 28 seconds (ref 24–37)

## 2013-07-23 MED ORDER — SODIUM CHLORIDE 0.9 % IV SOLN
INTRAVENOUS | Status: DC
Start: 2013-07-23 — End: 2013-07-24
  Administered 2013-07-23: 11:00:00 via INTRAVENOUS

## 2013-07-23 MED ORDER — IOHEXOL 300 MG/ML  SOLN
150.0000 mL | Freq: Once | INTRAMUSCULAR | Status: AC | PRN
  Administered 2013-07-23: 60 mL via INTRA_ARTERIAL

## 2013-07-23 MED ORDER — HEPARIN SOD (PORK) LOCK FLUSH 100 UNIT/ML IV SOLN
INTRAVENOUS | Status: AC | PRN
  Administered 2013-07-23: 500 [IU] via INTRAVENOUS

## 2013-07-23 MED ORDER — MIDAZOLAM HCL 2 MG/2ML IJ SOLN
INTRAMUSCULAR | Status: AC
  Filled 2013-07-23: qty 2

## 2013-07-23 MED ORDER — SODIUM CHLORIDE 0.9 % IV SOLN: INTRAVENOUS | Status: AC

## 2013-07-23 MED ORDER — FENTANYL CITRATE 0.05 MG/ML IJ SOLN
INTRAMUSCULAR | Status: AC
  Filled 2013-07-23: qty 2

## 2013-07-23 MED ORDER — MIDAZOLAM HCL 2 MG/2ML IJ SOLN
INTRAMUSCULAR | Status: AC | PRN
  Administered 2013-07-23: 1 mg via INTRAVENOUS

## 2013-07-23 MED ORDER — FENTANYL CITRATE 0.05 MG/ML IJ SOLN
INTRAMUSCULAR | Status: AC | PRN
  Administered 2013-07-23: 25 ug via INTRAVENOUS

## 2013-07-23 NOTE — Progress Notes (Signed)
Assumed care of pt from Darcel SmallingNancy Long, Charity fundraiserN. Assessment documented. Denies pain.

## 2013-07-23 NOTE — Sedation Documentation (Signed)
5 Fr exoseal closure by Chris Hines, RT 

## 2013-07-23 NOTE — Procedures (Signed)
S/P 4 vessel cerebral arteriogram RT CFA approach.Findings. 1.aprrox 70 to 75 % stenosis of Lt MCA prox . 2.Approx 65 to 70 % stenosis of RT MCA prox.

## 2013-07-23 NOTE — Sedation Documentation (Signed)
MD at bedside.  Explaining findings to pt.

## 2013-07-23 NOTE — Discharge Instructions (Signed)
Angiography, Care After Refer to this sheet in the next few weeks. These instructions provide you with information on caring for yourself after your procedure. Your health care provider may also give you more specific instructions. Your treatment has been planned according to current medical practices, but problems sometimes occur. Call your health care provider if you have any problems or questions after your procedure.  WHAT TO EXPECT AFTER THE PROCEDURE After your procedure, it is typical to have the following sensations:  Minor discomfort or tenderness and a small bump at the catheter insertion site. The bump should usually decrease in size and tenderness within 1 to 2 weeks.  Any bruising will usually fade within 2 to 4 weeks. HOME CARE INSTRUCTIONS   You may need to keep taking blood thinners if they were prescribed for you. Only take over-the-counter or prescription medicines for pain, fever, or discomfort as directed by your health care provider.  Do not apply powder or lotion to the site.  Do not sit in a bathtub, swimming pool, or whirlpool for 5 to 7 days.  You may shower 24 hours after the procedure. Remove the bandage (dressing) and gently wash the site with plain soap and water. Gently pat the site dry.  Inspect the site at least twice daily.  Limit your activity for the first 48 hours. Do not bend, squat, or lift anything over 20 lb (9 kg) or as directed by your health care provider.  Do not drive home if you are discharged the day of the procedure. Have someone else drive you. Follow instructions about when you can drive or return to work. SEEK MEDICAL CARE IF:  You get lightheaded when standing up.  You have drainage (other than a small amount of blood on the dressing).  You have chills.  You have a fever.  You have redness, warmth, swelling, or pain at the insertion site. SEEK IMMEDIATE MEDICAL CARE IF:   You develop chest pain or shortness of breath, feel faint,  or pass out.  You have bleeding, swelling larger than a walnut, or drainage from the catheter insertion site.  You develop pain, discoloration, coldness, or severe bruising in the leg or arm that held the catheter.  You develop bleeding from any other place, such as the bowels. You may see bright red blood in your urine or stools, or your stools may appear black and tarry.  You have heavy bleeding from the site. If this happens, hold pressure on the site. MAKE SURE YOU:  Understand these instructions.  Will watch your condition.  Will get help right away if you are not doing well or get worse. Document Released: 09/23/2004 Document Revised: 11/07/2012 Document Reviewed: 07/30/2012 [facility] Patient Information 2014 [place], [place].  May resume  Metformin on Thursday May 7

## 2013-07-23 NOTE — H&P (Signed)
Chief Complaint: "I am here for a follow-up study." HPI: Fred Jenkins is an 58 y.o. male with history of CVA and known cerebrovascular disease. Last cerebral arteriogram was 07/2012 with no intervention at that time as the patient was asymptomatic, he remain on aspirin and plavix daily. He did have a MRI 05/2013 and is scheduled today for a cerebral arteriogram. He denies any new neurological symptoms, he denies any tobacco use and states he does take his medications daily as instructed. He denies any chest pain, shortness of breath or palpitations. He denies any active signs of bleeding or excessive bruising. He denies any recent fever or chills. The patient denies any history of sleep apnea or chronic oxygen use. He has previously tolerated sedation and iodinated contrast without complications.   Past Medical History:  Past Medical History  Diagnosis Date  . Hypertension   . Diabetes mellitus   . Hyperlipidemia   . Stroke     Past Surgical History:  Past Surgical History  Procedure Laterality Date  . No past surgeries    . Coronary artery bypass graft      Family History: No family history on file.  Social History:  reports that he has never smoked. He does not have any smokeless tobacco history on file. He reports that he does not drink alcohol or use illicit drugs.  Allergies: No Known Allergies  Medications:   Medication List    ASK your doctor about these medications       amLODipine 10 MG tablet  Commonly known as:  NORVASC  Take 10 mg by mouth daily.     aspirin EC 81 MG tablet  Take 81 mg by mouth daily.     carvedilol 12.5 MG tablet  Commonly known as:  COREG  Take 12.5 mg by mouth 2 (two) times daily with a meal.     clopidogrel 75 MG tablet  Commonly known as:  PLAVIX  Take 75 mg by mouth daily.     famotidine 20 MG tablet  Commonly known as:  PEPCID  Take 20 mg by mouth daily.     glipiZIDE 10 MG 24 hr tablet  Commonly known as:  GLUCOTROL XL  Take  10 mg by mouth daily with breakfast.     lisinopril 10 MG tablet  Commonly known as:  PRINIVIL,ZESTRIL  Take 10 mg by mouth daily.     metFORMIN 1000 MG tablet  Commonly known as:  GLUCOPHAGE  Take 1,000 mg by mouth 2 (two) times daily with a meal.     pravastatin 80 MG tablet  Commonly known as:  PRAVACHOL  Take 80 mg by mouth at bedtime.       Please HPI for pertinent positives, otherwise complete 10 system ROS negative.  Physical Exam: BP 144/85  Pulse 57  Temp(Src) 97.5 F (36.4 C) (Oral)  Resp 20  Ht 5' 8"  (1.727 m)  Wt 180 lb (81.647 kg)  BMI 27.38 kg/m2  SpO2 100% Body mass index is 27.38 kg/(m^2).  General Appearance:  Alert, cooperative, no distress,  Head:  Normocephalic, without obvious abnormality, atraumatic  Neck: Supple, symmetrical, trachea midline  Lungs:   Clear to auscultation bilaterally, no w/r/r, respirations unlabored without use of accessory muscles.  Chest Wall:  No tenderness or deformity  Heart:  Regular rate and rhythm, S1, S2 normal, no murmur, rub or gallop.  Abdomen:   Soft, non-tender, non distended, (+) BS  Extremities: Extremities normal, atraumatic, no cyanosis or edema  Pulses: 1+ and symmetric  Neurologic: Normal affect, no gross deficits, smile symmetrical, tongue midline, no ataxia, equal strength bilaterally upper and lower extremities.    Results for orders placed during the hospital encounter of 07/23/13 (from the past 48 hour(s))  GLUCOSE, CAPILLARY     Status: None   Collection Time    07/23/13  9:48 AM      Result Value Ref Range   Glucose-Capillary 82  70 - 99 mg/dL  APTT     Status: None   Collection Time    07/23/13 10:05 AM      Result Value Ref Range   aPTT 28  24 - 37 seconds  BASIC METABOLIC PANEL     Status: Abnormal   Collection Time    07/23/13 10:05 AM      Result Value Ref Range   Sodium 142  137 - 147 mEq/L   Potassium 4.7  3.7 - 5.3 mEq/L   Chloride 103  96 - 112 mEq/L   CO2 28  19 - 32 mEq/L    Glucose, Bld 100 (*) 70 - 99 mg/dL   BUN 7  6 - 23 mg/dL   Creatinine, Ser 0.79  0.50 - 1.35 mg/dL   Calcium 9.3  8.4 - 10.5 mg/dL   GFR calc non Af Amer >90  >90 mL/min   GFR calc Af Amer >90  >90 mL/min   Comment: (NOTE)     The eGFR has been calculated using the CKD EPI equation.     This calculation has not been validated in all clinical situations.     eGFR's persistently <90 mL/min signify possible Chronic Kidney     Disease.  CBC WITH DIFFERENTIAL     Status: Abnormal (Preliminary result)   Collection Time    07/23/13 10:05 AM      Result Value Ref Range   WBC 5.5  4.0 - 10.5 K/uL   RBC 5.32  4.22 - 5.81 MIL/uL   Hemoglobin 12.3 (*) 13.0 - 17.0 g/dL   HCT 36.7 (*) 39.0 - 52.0 %   MCV 69.0 (*) 78.0 - 100.0 fL   MCH 23.1 (*) 26.0 - 34.0 pg   MCHC 33.5  30.0 - 36.0 g/dL   RDW 17.2 (*) 11.5 - 15.5 %   Platelets 175  150 - 400 K/uL   Comment: SPECIMEN CHECKED FOR CLOTS     REPEATED TO VERIFY   Neutrophils Relative % PENDING  43 - 77 %   Neutro Abs PENDING  1.7 - 7.7 K/uL   Band Neutrophils PENDING  0 - 10 %   Lymphocytes Relative PENDING  12 - 46 %   Lymphs Abs PENDING  0.7 - 4.0 K/uL   Monocytes Relative PENDING  3 - 12 %   Monocytes Absolute PENDING  0.1 - 1.0 K/uL   Eosinophils Relative PENDING  0 - 5 %   Eosinophils Absolute PENDING  0.0 - 0.7 K/uL   Basophils Relative PENDING  0 - 1 %   Basophils Absolute PENDING  0.0 - 0.1 K/uL   WBC Morphology PENDING     RBC Morphology PENDING     Smear Review PENDING     nRBC PENDING  0 /100 WBC   Metamyelocytes Relative PENDING     Myelocytes PENDING     Promyelocytes Absolute PENDING     Blasts PENDING    PROTIME-INR     Status: None   Collection Time    07/23/13 10:05 AM  Result Value Ref Range   Prothrombin Time 12.5  11.6 - 15.2 seconds   INR 0.95  0.00 - 1.49   No results found.  Assessment/Plan History of CVA with cerebrovascular disease Last cerebral arteriogram 07/2012 MRI 05/2013 Scheduled today for  cerebral arteriogram, patient with no new neurological symptoms on aspirin and plavix daily Patient has been NPO, afebrile, labs reviewed. Risks and Benefits discussed with the patient. All of the patient's questions were answered, patient is agreeable to proceed. Consent signed and in chart.   Hedy Jacob PA-C 07/23/2013, 11:28 AM

## 2014-03-20 ENCOUNTER — Other Ambulatory Visit (HOSPITAL_COMMUNITY): Payer: Self-pay | Admitting: Interventional Radiology

## 2014-03-20 DIAGNOSIS — I639 Cerebral infarction, unspecified: Secondary | ICD-10-CM

## 2014-03-20 DIAGNOSIS — I771 Stricture of artery: Secondary | ICD-10-CM

## 2014-03-28 ENCOUNTER — Other Ambulatory Visit: Payer: Self-pay | Admitting: Radiology

## 2014-03-31 ENCOUNTER — Other Ambulatory Visit: Payer: Self-pay | Admitting: Radiology

## 2014-04-01 ENCOUNTER — Encounter (HOSPITAL_COMMUNITY): Payer: Self-pay

## 2014-04-01 ENCOUNTER — Other Ambulatory Visit (HOSPITAL_COMMUNITY): Payer: Self-pay | Admitting: Interventional Radiology

## 2014-04-01 ENCOUNTER — Ambulatory Visit (HOSPITAL_COMMUNITY)
Admission: RE | Admit: 2014-04-01 | Discharge: 2014-04-01 | Disposition: A | Discharge status: Home / Self Care | Payer: Medicare Other | Source: Ambulatory Visit | Attending: Interventional Radiology | Admitting: Interventional Radiology

## 2014-04-01 DIAGNOSIS — I639 Cerebral infarction, unspecified: Secondary | ICD-10-CM

## 2014-04-01 DIAGNOSIS — Z7902 Long term (current) use of antithrombotics/antiplatelets: Secondary | ICD-10-CM | POA: Diagnosis not present

## 2014-04-01 DIAGNOSIS — E119 Type 2 diabetes mellitus without complications: Secondary | ICD-10-CM | POA: Insufficient documentation

## 2014-04-01 DIAGNOSIS — I10 Essential (primary) hypertension: Secondary | ICD-10-CM | POA: Diagnosis not present

## 2014-04-01 DIAGNOSIS — I771 Stricture of artery: Secondary | ICD-10-CM

## 2014-04-01 DIAGNOSIS — F1290 Cannabis use, unspecified, uncomplicated: Secondary | ICD-10-CM | POA: Insufficient documentation

## 2014-04-01 DIAGNOSIS — Z8673 Personal history of transient ischemic attack (TIA), and cerebral infarction without residual deficits: Secondary | ICD-10-CM | POA: Diagnosis not present

## 2014-04-01 DIAGNOSIS — I672 Cerebral atherosclerosis: Secondary | ICD-10-CM | POA: Diagnosis present

## 2014-04-01 DIAGNOSIS — Z79899 Other long term (current) drug therapy: Secondary | ICD-10-CM | POA: Diagnosis not present

## 2014-04-01 DIAGNOSIS — Z7982 Long term (current) use of aspirin: Secondary | ICD-10-CM | POA: Diagnosis not present

## 2014-04-01 DIAGNOSIS — E785 Hyperlipidemia, unspecified: Secondary | ICD-10-CM | POA: Diagnosis not present

## 2014-04-01 DIAGNOSIS — I6603 Occlusion and stenosis of bilateral middle cerebral arteries: Secondary | ICD-10-CM | POA: Insufficient documentation

## 2014-04-01 LAB — BASIC METABOLIC PANEL
Anion gap: 9 (ref 5–15)
BUN: 10 mg/dL (ref 6–23)
CALCIUM: 9 mg/dL (ref 8.4–10.5)
CO2: 25 mmol/L (ref 19–32)
CREATININE: 0.82 mg/dL (ref 0.50–1.35)
Chloride: 105 mEq/L (ref 96–112)
GFR calc Af Amer: >90 mL/min (ref >90)
GLUCOSE: 181 mg/dL — AB (ref 70–99)
Potassium: 4 mmol/L (ref 3.5–5.1)
Sodium: 139 mmol/L (ref 135–145)

## 2014-04-01 LAB — CBC WITH DIFFERENTIAL/PLATELET
BASOS ABS: 0.1 10*3/uL (ref 0.0–0.1)
BASOS PCT: 1 % (ref 0–1)
EOS ABS: 0.2 10*3/uL (ref 0.0–0.7)
Eosinophils Relative: 4 % (ref 0–5)
HCT: 37.2 % — ABNORMAL LOW (ref 39.0–52.0)
HEMOGLOBIN: 12.4 g/dL — AB (ref 13.0–17.0)
LYMPHS PCT: 34 % (ref 12–46)
Lymphs Abs: 1.8 10*3/uL (ref 0.7–4.0)
MCH: 24.9 pg — ABNORMAL LOW (ref 26.0–34.0)
MCHC: 33.3 g/dL (ref 30.0–36.0)
MCV: 74.8 fL — ABNORMAL LOW (ref 78.0–100.0)
MONOS PCT: 6 % (ref 3–12)
Monocytes Absolute: 0.3 10*3/uL (ref 0.1–1.0)
NEUTROS ABS: 2.8 10*3/uL (ref 1.7–7.7)
NEUTROS PCT: 55 % (ref 43–77)
Platelets: 138 10*3/uL — ABNORMAL LOW (ref 150–400)
RBC: 4.97 MIL/uL (ref 4.22–5.81)
RDW: 14.5 % (ref 11.5–15.5)
WBC: 5.2 10*3/uL (ref 4.0–10.5)

## 2014-04-01 LAB — APTT: aPTT: 27 seconds (ref 24–37)

## 2014-04-01 LAB — PROTIME-INR
INR: 0.97 (ref 0.00–1.49)
Prothrombin Time: 13 seconds (ref 11.6–15.2)

## 2014-04-01 LAB — GLUCOSE, CAPILLARY: Glucose-Capillary: 149 mg/dL — ABNORMAL HIGH (ref 70–99)

## 2014-04-01 MED ORDER — LIDOCAINE HCL 1 % IJ SOLN
INTRAMUSCULAR | Status: AC
  Filled 2014-04-01: qty 20

## 2014-04-01 MED ORDER — FENTANYL CITRATE 0.05 MG/ML IJ SOLN
INTRAMUSCULAR | Status: AC | PRN
  Administered 2014-04-01: 25 ug via INTRAVENOUS

## 2014-04-01 MED ORDER — MIDAZOLAM HCL 2 MG/2ML IJ SOLN
INTRAMUSCULAR | Status: AC
  Filled 2014-04-01: qty 4

## 2014-04-01 MED ORDER — IOHEXOL 300 MG/ML  SOLN
150.0000 mL | Freq: Once | INTRAMUSCULAR | Status: AC | PRN
  Administered 2014-04-01: 70 mL via INTRA_ARTERIAL

## 2014-04-01 MED ORDER — HEPARIN SODIUM (PORCINE) 1000 UNIT/ML IJ SOLN
INTRAMUSCULAR | Status: AC
  Filled 2014-04-01: qty 1

## 2014-04-01 MED ORDER — FENTANYL CITRATE 0.05 MG/ML IJ SOLN
INTRAMUSCULAR | Status: AC
  Filled 2014-04-01: qty 2

## 2014-04-01 MED ORDER — MIDAZOLAM HCL 2 MG/2ML IJ SOLN
INTRAMUSCULAR | Status: AC | PRN
  Administered 2014-04-01: 1 mg via INTRAVENOUS

## 2014-04-01 MED ORDER — SODIUM CHLORIDE 0.9 % IV SOLN
INTRAVENOUS | Status: AC
  Administered 2014-04-01: 11:00:00 via INTRAVENOUS

## 2014-04-01 MED ORDER — SODIUM CHLORIDE 0.9 % IV SOLN
Freq: Once | INTRAVENOUS | Status: AC
  Administered 2014-04-01: 09:00:00 via INTRAVENOUS

## 2014-04-01 MED ORDER — HEPARIN SODIUM (PORCINE) 1000 UNIT/ML IJ SOLN
INTRAMUSCULAR | Status: AC | PRN
  Administered 2014-04-01: 1000 [IU] via INTRAVENOUS

## 2014-04-01 NOTE — Sedation Documentation (Signed)
Leandra KernBecky Frye, Radiology tech holding pressure to rt groin following exoseal insertion. Pt tolerating well.

## 2014-04-01 NOTE — H&P (Signed)
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 male  Pt known to NIR Has had at least 3 CVA Initially seen for cerebral arteriogram 06/2009 Revealed B MCA stenosis Initiated ASA/Plavix and followed with arteriograms Most recent cerebral arteriogram 07/2013 does show continued B MCA stenosis now with B PCA stenosis Pt has remained asymptomatic on ASA/Plavix Denies gait problems; vision or speech issues; no numbness or tingling Now for recheck arteriogram Continues to use marijuana daily Last use 03/31/14 pm   Past Medical History  Diagnosis Date  . Hypertension   . Diabetes mellitus   . Hyperlipidemia   . Stroke     Past Surgical History  Procedure Laterality Date  . No past surgeries    . Coronary artery bypass graft      Allergies: Review of patient's allergies indicates no known allergies.  Medications: Prior to Admission medications   Medication Sig Start Date End Date Taking? Authorizing Provider  amLODipine (NORVASC) 10 MG tablet Take 10 mg by mouth daily.   Yes Historical Provider, MD  aspirin EC 81 MG tablet Take 81 mg by mouth daily.   Yes Historical Provider, MD  carvedilol (COREG) 12.5 MG tablet Take 12.5 mg by mouth 2 (two) times daily with a meal.   Yes Historical Provider, MD  clopidogrel (PLAVIX) 75 MG tablet Take 75 mg by mouth daily.   Yes Historical Provider, MD  famotidine (PEPCID) 20 MG tablet Take 20 mg by mouth daily.   Yes Historical Provider, MD  glipiZIDE (GLUCOTROL XL) 5 MG 24 hr tablet Take 5 mg by mouth daily with breakfast.  03/04/14  Yes Historical Provider, MD  lisinopril (PRINIVIL,ZESTRIL) 10 MG tablet Take 10 mg by mouth daily.   Yes Historical Provider, MD  metFORMIN (GLUCOPHAGE) 1000 MG tablet Take 1,000 mg by mouth 2 (two) times daily with a meal.   Yes Historical Provider, MD  pravastatin  (PRAVACHOL) 80 MG tablet Take 80 mg by mouth at bedtime.   Yes Historical Provider, MD    History reviewed. No pertinent family history.  History   Social History  . Marital Status: Single    Spouse Name: N/A    Number of Children: N/A  . Years of Education: N/A   Social History Main Topics  . Smoking status: Never Smoker   . Smokeless tobacco: None  . Alcohol Use: No  . Drug Use: 7.00 per week    Special: Marijuana  . Sexual Activity: Not Currently   Other Topics Concern  . None   Social History Narrative    Review of Systems: A 12 point ROS discussed and pertinent positives are indicated in the HPI above.  All other systems are negative.  Review of Systems  Constitutional: Negative for activity change, appetite change, fatigue and unexpected weight change.  HENT: Positive for dental problem. Negative for tinnitus and trouble swallowing.   Respiratory: Negative for cough and shortness of breath.   Cardiovascular: Negative for chest pain.  Gastrointestinal: Negative for abdominal pain.  Genitourinary: Negative for difficulty urinating.  Musculoskeletal: Negative for back pain.  Neurological: Negative for dizziness, tremors, seizures, syncope, facial asymmetry, speech difficulty, weakness, light-headedness, numbness and headaches.  Psychiatric/Behavioral: Negative for confusion.    Vital Signs: BP 155/94 mmHg  Pulse 66  Temp(Src) 98.3 F (36.8 C) (Oral)  Resp 18  Ht 5\' 8"  (1.727 m)  Wt 81.647  kg (180 lb)  BMI 27.38 kg/m2  SpO2 100%  Physical Exam  Constitutional: He is oriented to person, place, and time.  Cardiovascular: Normal rate and regular rhythm.   No murmur heard. Pulmonary/Chest: Effort normal and breath sounds normal. He has no wheezes.  Abdominal: Soft. Bowel sounds are normal. There is no tenderness.  Musculoskeletal: Normal range of motion.  Neurological: He is alert and oriented to person, place, and time.  Skin: Skin is warm and dry.    Psychiatric: He has a normal mood and affect. His behavior is normal. Judgment and thought content normal.  Nursing note and vitals reviewed.   Imaging: No results found.  Labs:  CBC:  Recent Labs  07/23/13 1005  WBC 5.5  HGB 12.3*  HCT 36.7*  PLT 175    COAGS:  Recent Labs  07/23/13 1005  INR 0.95  APTT 28    BMP:  Recent Labs  06/04/13 0937 07/23/13 1005  NA  --  142  K  --  4.7  CL  --  103  CO2  --  28  GLUCOSE  --  100*  BUN  --  7  CALCIUM  --  9.3  CREATININE 0.64 0.79  GFRNONAA >90 >90  GFRAA >90 >90    LIVER FUNCTION TESTS: No results for input(s): BILITOT, AST, ALT, ALKPHOS, PROT, ALBUMIN in the last 8760 hours.  TUMOR MARKERS: No results for input(s): AFPTM, CEA, CA199, CHROMGRNA in the last 8760 hours.  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  I greatly enjoyed meeting Fred Jenkins and look forward to participating in their care.    I spent a total of 20 minutes face to face in clinical consultation, greater than 50% of which was counseling/coordinating care for cerebral arteriogram  Signed: TURPIN,PAMELA A 04/01/2014, 9:22 AM

## 2014-04-01 NOTE — Discharge Instructions (Signed)
1.Do not take metformin until Jan14 th,thursday    Arteriogram Care After These instructions give you information on caring for yourself after your procedure. Your doctor may also give you more specific instructions. Call your doctor if you have any problems or questions after your procedure. HOME CARE  Keep your leg straight for at least 6 hours.  Do not bathe, swim, or use a hot tub until directed by your doctor. You can shower.  Do not lift anything heavier than 10 pounds (about a gallon of milk) for 2 days.  Do not walk a lot, run, or drive for 2 days.  Return to normal activities in 2 days or as told by your doctor. Finding out the results of your test Ask when your test results will be ready. Make sure you get your test results. GET HELP RIGHT AWAY IF:   You have fever.  You have more pain in your leg.  The leg that was cut is:  Bleeding.  Puffy (swollen) or red.  Cold.  Pale or changes color.  Weak.  Tingly or numb. If you go to the Emergency Room, tell your nurse that you have had an arteriogram. Take this paper with you to show the nurse. MAKE SURE YOU:  Understand these instructions.  Will watch your condition.  Will get help right away if you are not doing well or get worse. Document Released: 06/03/2008 Document Revised: 03/12/2013 Document Reviewed: 06/03/2008 [facility] Patient Information 2015 [employer], [place]. This information is not intended to replace advice given to you by your health care provider. Make sure you discuss any questions you have with your health care provider.

## 2014-04-01 NOTE — Sedation Documentation (Signed)
Patient is resting comfortably. 

## 2014-04-01 NOTE — Sedation Documentation (Signed)
Dr. Corliss Skainseveshwar in room s/w pt and mother about results of test. Questions answered. Will see pt again in 6mos per MD.  Family v/u and agreed.

## 2014-04-01 NOTE — Sedation Documentation (Addendum)
Pt following commands to hold breathe when requested.

## 2014-04-01 NOTE — Sedation Documentation (Signed)
Dr. Corliss Skainseveshwar performed neuro assessment.

## 2014-04-01 NOTE — Procedures (Signed)
S/P 4 vessel cerebral arteriogram  RT CFA approach . Findings. 1.Approx 70 % and 50 % tandenm stenosis of Lt MCA prox. 2.Approx 50 % stenosis of RT MCA inferior division origin. 3.Scattered mild to mod arteriosclerotic intracranial changes

## 2014-04-01 NOTE — Sedation Documentation (Signed)
Patient denies pain and is resting comfortably.  

## 2014-04-01 NOTE — Sedation Documentation (Signed)
Dr Corliss Skainseveshwar in to s/w pt/family. Questions answered.

## 2015-05-11 IMAGING — XA IR VERTEBRAL  NON-SELECT UNILAT  RIGHT(MS)
2 of 3 series · 12 of 24 positions shown · IV contrast (IODINE)
Comparison: none

CLINICAL DATA: Known history of bilateral middle cerebral arteries
stenoses. Recent new ischemic infarct in the left thalamus.

[Series 9: carotid 1 · 1 of 1 slices shown]
[im 1/1]
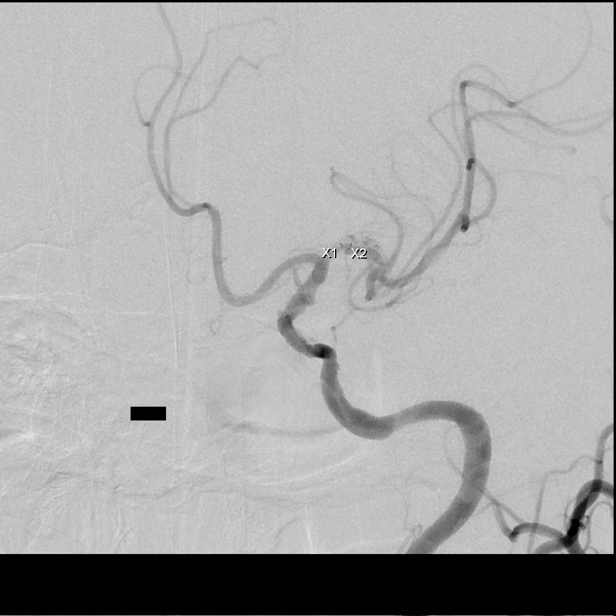

[Series 300: neuro · 11 of 155 slices shown]
[im 8/155]
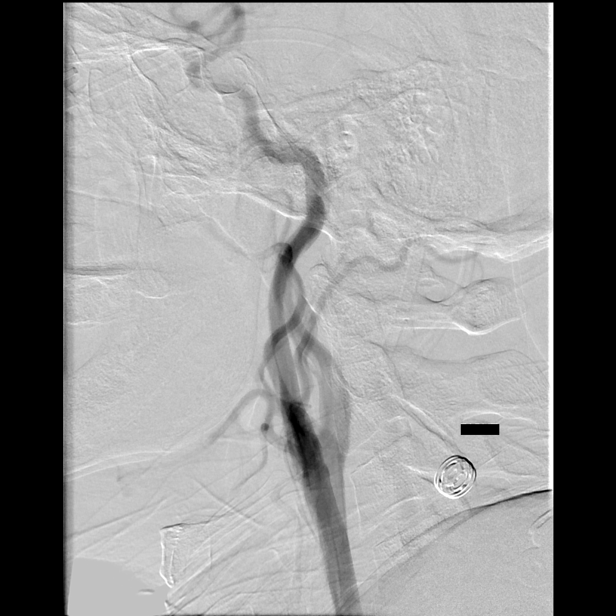
[im 23/155]
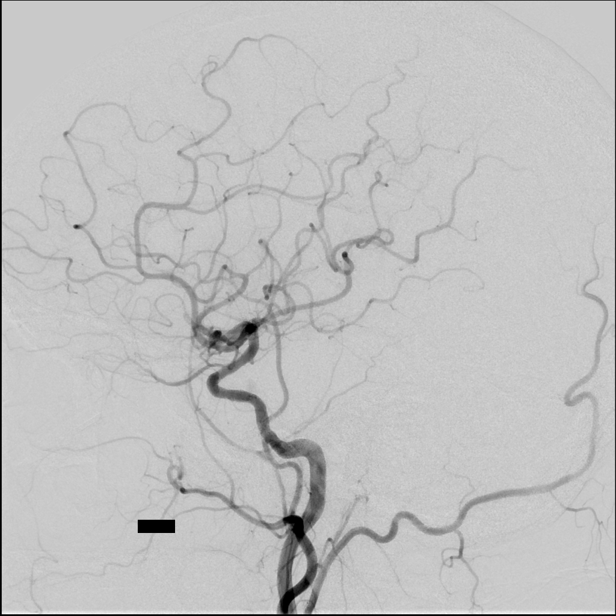
[im 37/155]
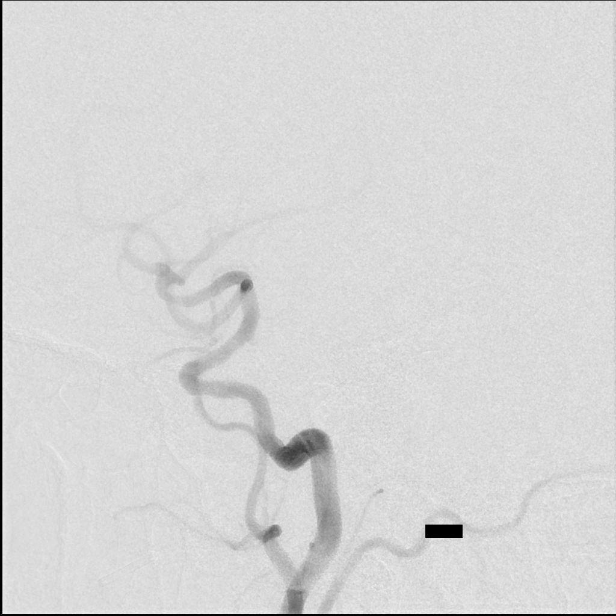
[im 52/155]
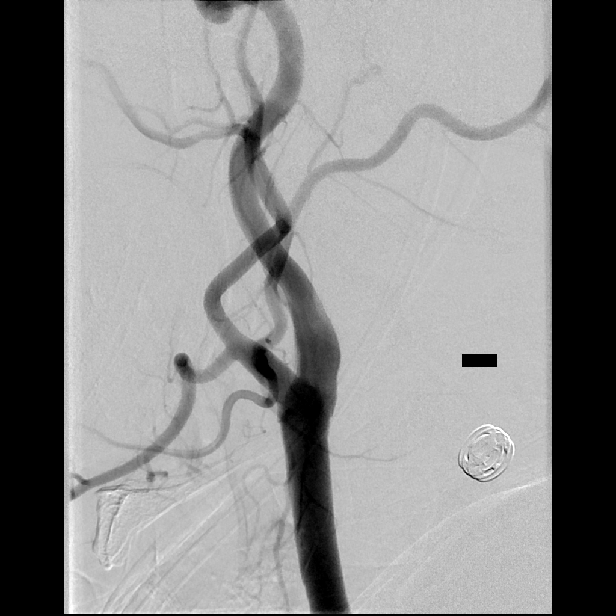
[im 67/155]
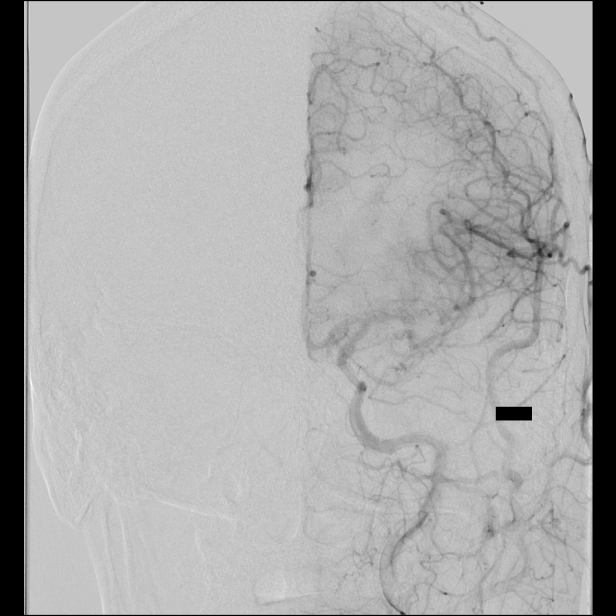
[im 81/155]
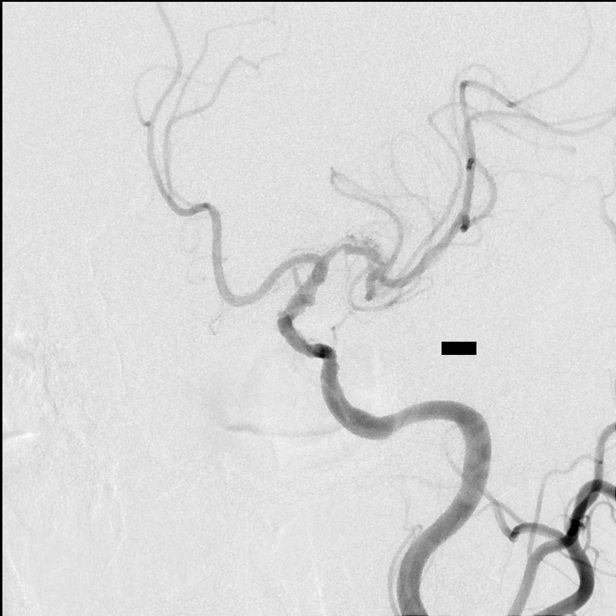
[im 96/155]
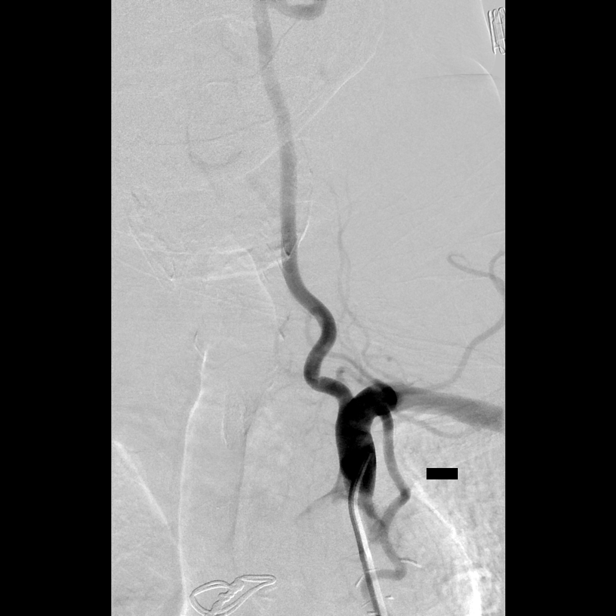
[im 111/155]
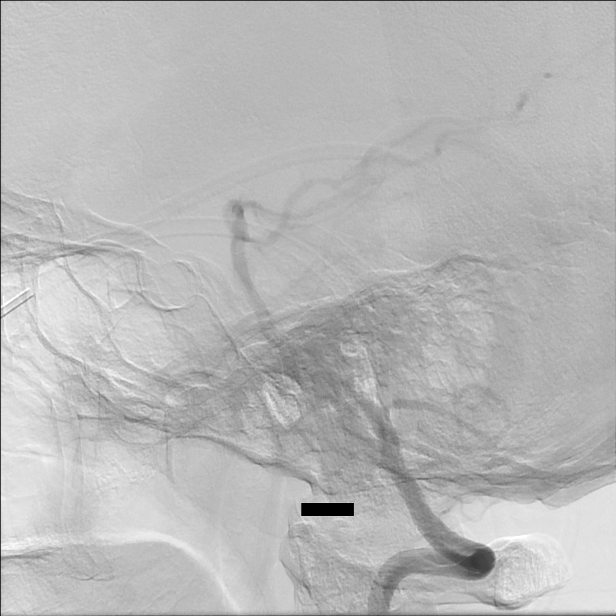
[im 125/155]
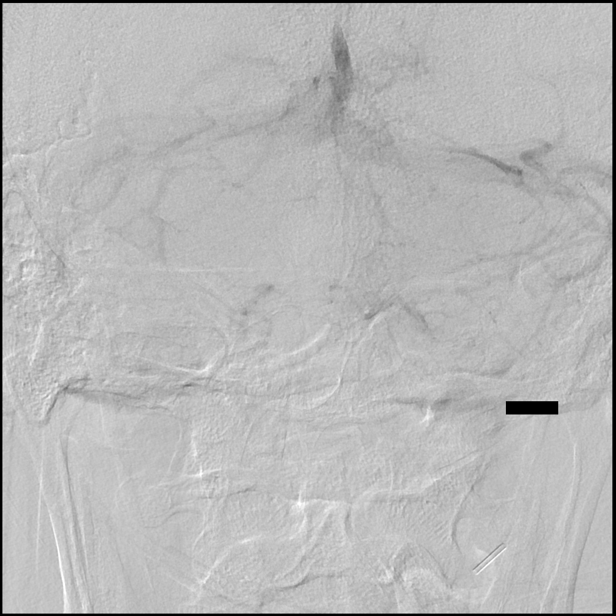
[im 140/155]
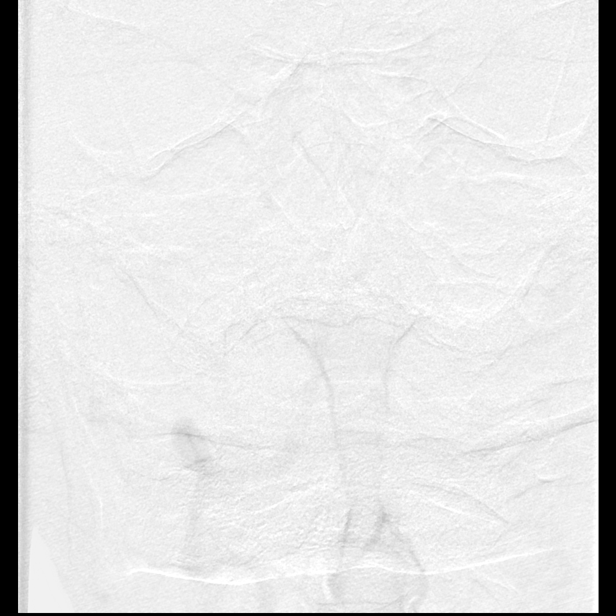
[im 155/155]
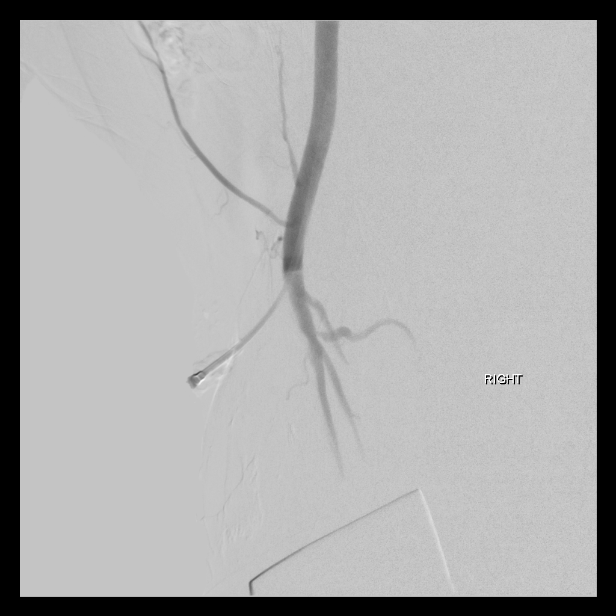

[12 of 24 positions shown; findings below may reference images not displayed]

EXAM:
BILATERAL COMMON CAROTID AND INNOMINATE ANGIOGRAPHY AND BILATERAL
VERTEBRAL ARTERY ANGIOGRAMS:

ANESTHESIA/SEDATION:
Conscious sedation.

MEDICATIONS:
Versed 1 mg IV. Fentanyl 25 mcg IV.

CONTRAST:  60mL OMNIPAQUE IOHEXOL 300 MG/ML SOLN

PROCEDURE:
Following a full explanation of the procedure along with the
potential associated complications, an informed witnessed consent
was obtained. The right groin was prepped and draped in the usual
sterile fashion. Thereafter, using modified Seldinger technique,
transfemoral access into the right common femoral artery was
obtained without difficulty. Over a 0.035 inch guidewire a 5 French
Pinnacle sheath was inserted. Through this, and also over a
inch guidewire a 5 French JB1 catheter was advanced to the aortic
arch region and selectively positioned in the right common carotid
artery, the left common carotid artery, the left vertebral artery,
and the right subclavian artery.

The patient tolerated the procedure well.

COMPLICATIONS:
None immediate
FINDINGS: The right common carotid arteriogram demonstrates the right external
carotid artery and its major branches to be widely patent.

The right internal carotid artery at the bulb to the cranial skull
base opacifies normally.

The petrous, the cavernous and the supraclinoid segments are widely
patent.

The right middle cerebral artery demonstrates mild narrowing at its
origin. Vessel demonstrates approximately 70 percent stenoses of the
distal right M1 segment. Branches distal to this appear normally
opacified into the capillary and the venous phases. The right
anterior cerebral artery opacifies normally into the capillary and
the venous phases.

A few scattered mild focal irregular caliber changes suggest
intracranial arteriosclerosis.

The left common carotid arteriogram demonstrates the left external
carotid artery and its major branches to be normal.

The left internal carotid artery at the bulb to the cranial skull
base opacifies normally.

There is mild fusiform prominence of the distal petrous segment.

Mild diffuse narrowing is seen of the cavernous segment. The
supraclinoid segment is widely patent.

The left middle cerebral artery has a 75% to 80% narrowing in the M1
segment.

Distal to this, the trifurcation branches opacify normally.

Left anterior cerebral artery opacifies normally into the capillary
and the venous phases.

A few scattered focal areas of mild caliber irregularity are seen in
the pericallosal and the callosal marginal branches suggestive of
intracranial arteriosclerosis.

The left vertebral artery origin is normal. The vessel opacifies
normally to the cranial skull base. Opacification of the left
vertebrobasilar junction is normal.

Patency of the left posterior-inferior cerebellar artery is seen
with associated focal areas of mild caliber irregularity and
narrowing most consistent with intracranial arteriosclerosis.

Basilar artery, superior cerebellarl arteries and the left anterior
inferior cerebellar artery opacify normally into the capillary and
the venous phases. There is modest narrowing at the origin of the
right anterior inferior cerebellar artery. Both posterior cerebral
arteries demonstrate approximately 50% narrowing in the proximal P1
segments with flow noted distally into vessels with mild caliber
irregularity again suggestive of intracranial arteriosclerosis. The
right subclavian artery has an aberrant origin developmental
variation.

The origin of the right vertebral artery is normal. The vessel
opacifies normally through the cranial skull base.
IMPRESSION: Approximately 75-80% narrowing of the left middle cerebral artery M1
segment.

Approximately 65-70% stenoses of the right middle cerebral artery M1
segment. Both of these lesions have progressed compared to the
previous arteriogram.

Stable, approximately 50% narrowing of the posterior cerebral
arteries bilaterally in the P1 segments.

The angiographic findings were reviewed with the patient. The
patient remains essentially asymptomatic at this time despite the
angiographic progression of the middle cerebral artery disease
bilaterally. It's best to continue with aspirin and Plavix in
addition to controlling his diabetes and high blood pressure. He was
also strongly advised to stop smoking. Follow-up arteriogram will be
undertaken in 6 months time given the above findings, or sooner
should the patient become symptomatic.

## 2016-01-18 IMAGING — XA IR VERTEBRAL  SELECTIVE  UNILAT LEFT (MS)
1 series · 12 of 24 positions shown · IV contrast (IODINE)
Comparison: none

CLINICAL DATA: Patient with a intermittent headaches. Previous
history of left-sided cerebral hemispheric ischemic symptoms.
Bilateral middle cerebral artery stenosis.

[Series 300: neuro · 12 of 139 slices shown]
[im 7/139]
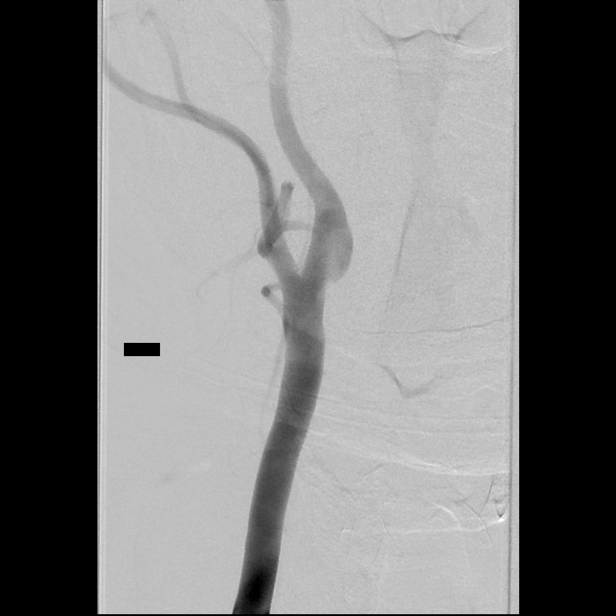
[im 19/139]
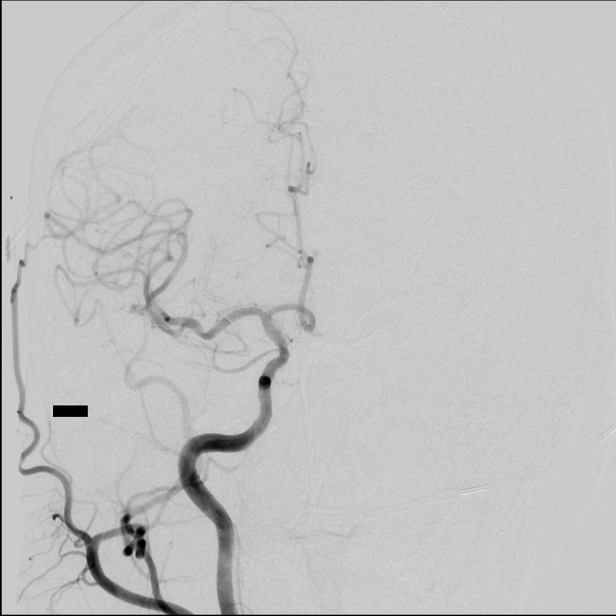
[im 31/139]
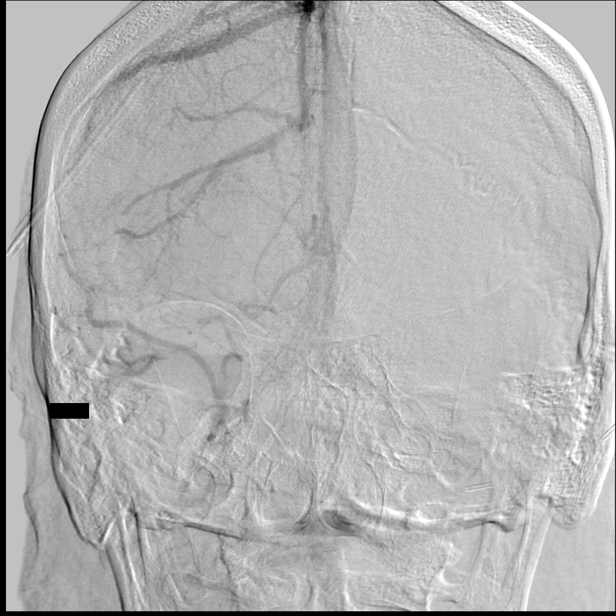
[im 43/139]
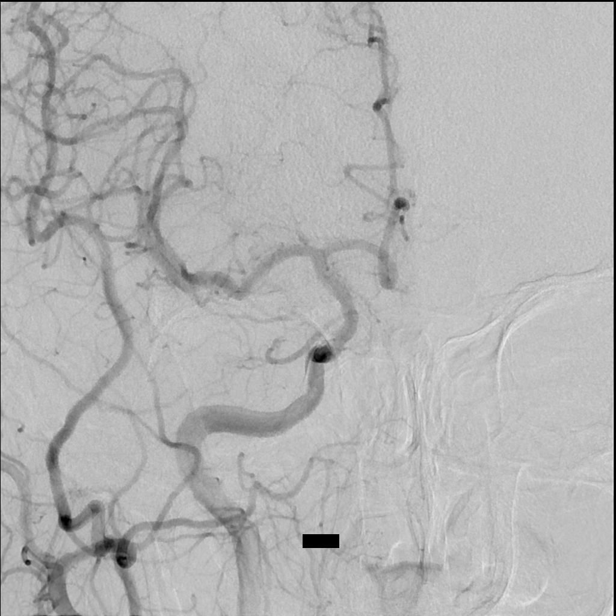
[im 55/139]
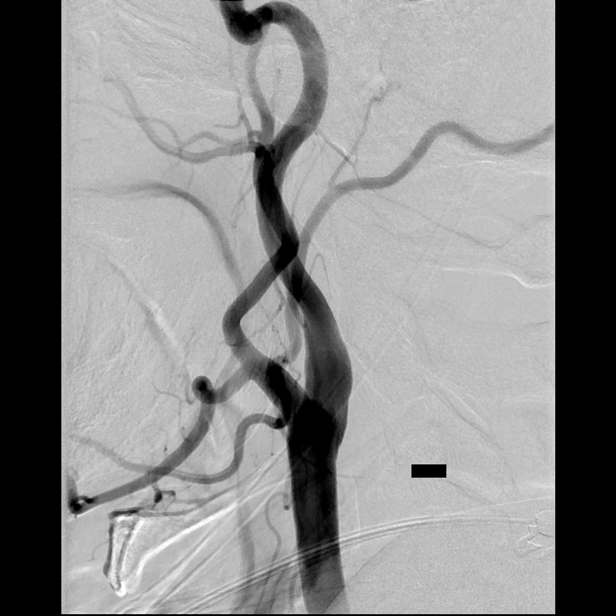
[im 67/139]
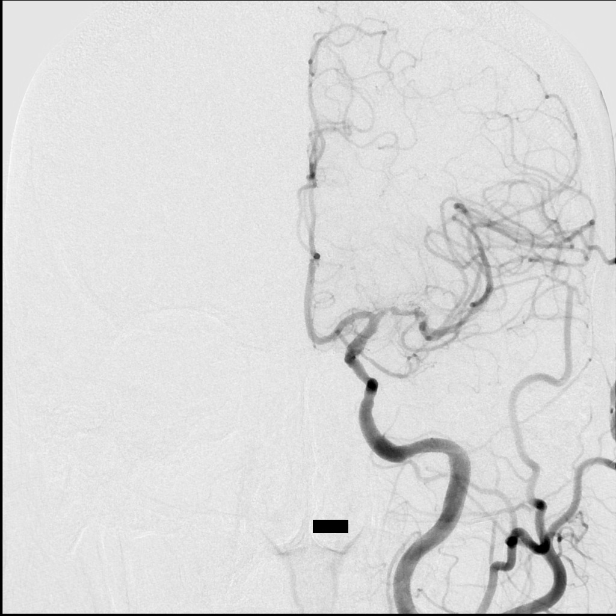
[im 79/139]
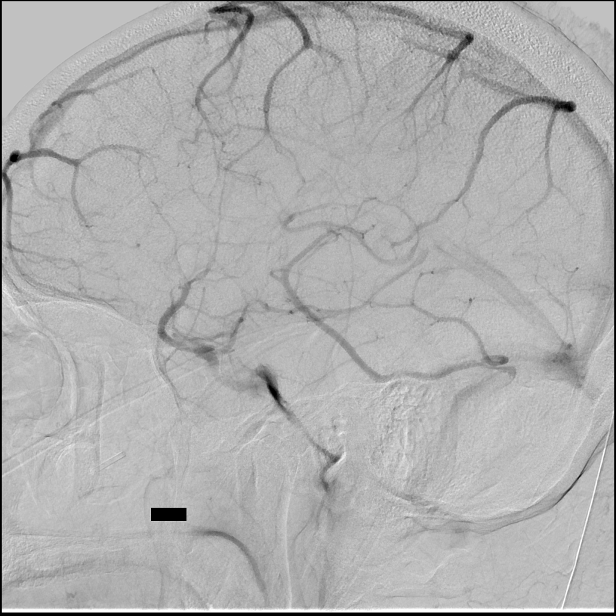
[im 91/139]
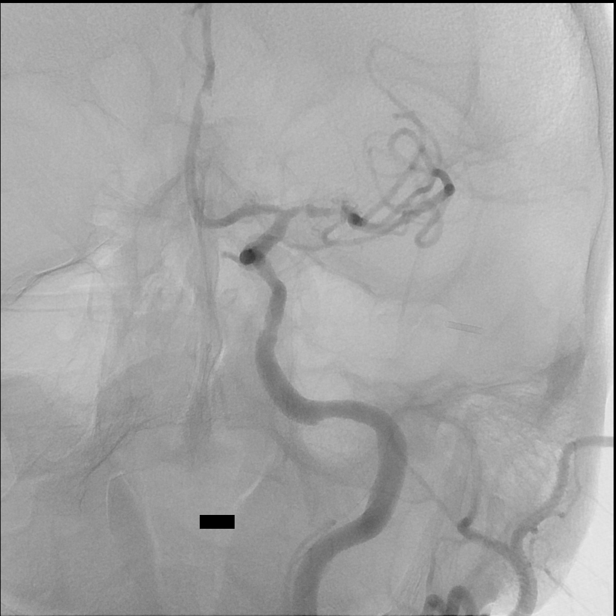
[im 103/139]
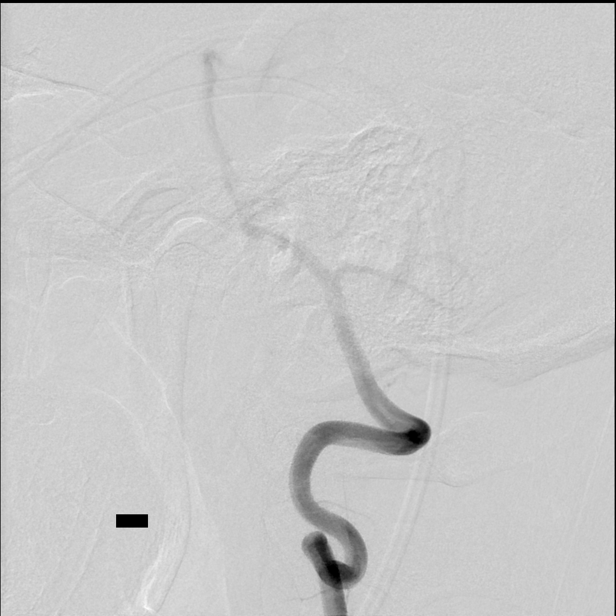
[im 115/139]
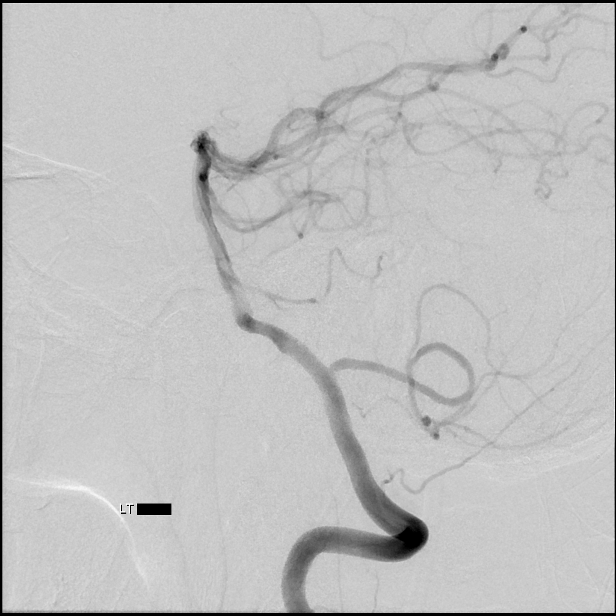
[im 127/139]
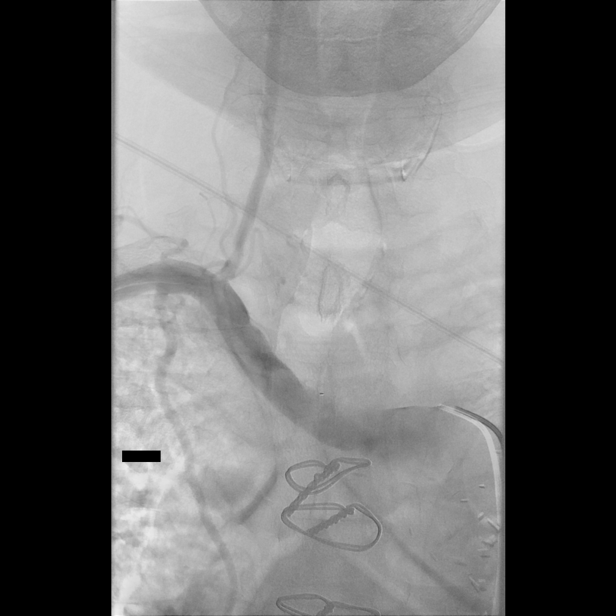
[im 139/139]
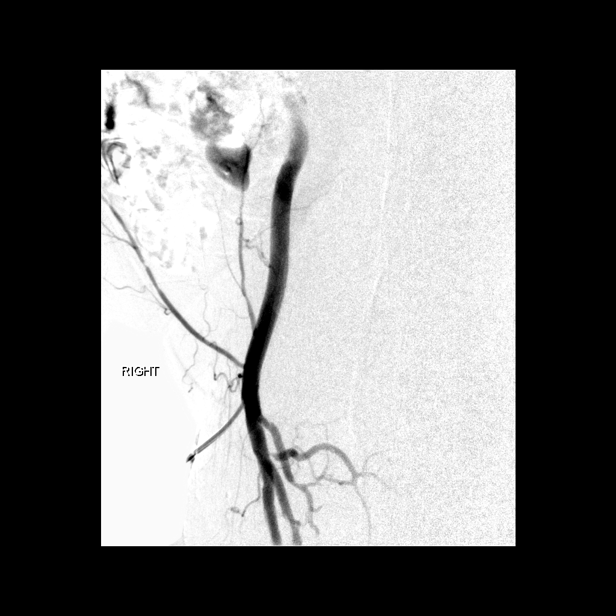

[12 of 24 positions shown; findings below may reference images not displayed]

EXAM:
BILATERAL COMMON CAROTID AND INNOMINATE ANGIOGRAPHY AND BILATERAL
VERTEBRAL ARTERY ANGIOGRAMS

PROCEDURE:
Contrast: 60 mL OMNIPAQUE IOHEXOL 300 MG/ML  SOLN

Anesthesia/Sedation:  Conscious sedation.

Medications: Versed 1 mg IV.  Fentanyl 25 mcg IV.

Following a full explanation of the procedure along with the
potential associated complications, an informed witnessed consent
was obtained.

The right groin was prepped and draped in the usual sterile fashion.
Thereafter using modified Seldinger technique, transfemoral access
into the right common femoral artery was obtained without
difficulty. Over a 0.035 inch guidewire, a 5 French Pinnacle sheath
was inserted. Through this, and also over 0.035 inch guidewire, a 5
French JB1 catheter was advanced to the aortic arch region and
selectively positioned in the right common carotid artery, the right
subclavian artery, the left common carotid artery and the left
vertebral artery.

There were no acute complications. The patient tolerated the
procedure well.
FINDINGS: The right common carotid arteriogram demonstrates the right external
carotid artery and its major branches to be normal.

The right internal carotid artery at the bulb to the cranial skull
base opacifies normally.

The petrous, the cavernous and the supraclinoid segments are widely
patent.

The right middle cerebral artery just proximal to the bifurcation
demonstrates approximately 50% narrowing as does the origin of the
inferior division of the right middle cerebral artery.

Distal to this the right anterior cerebral artery opacifies normally
into the capillary and venous phases.

The right common carotid artery is the first branch from the aortic
arch consistent with a right aberrant subclavian artery.

The left common carotid arteriogram demonstrates the left external
carotid artery and its major branches to be normal.

The left common carotid arteriogram demonstrates the left external
carotid artery and its major branches to be normal.

The left internal carotid artery at the bulb demonstrates a minimal
atherosclerotic plaque along the posterior wall. No associated
stenosis recognized by the NASCET criteria.

The left internal carotid artery is seen to opacify to the cranial
skull base.

There is a mild fusiform narrowing of the cervical petrous junction
at the distal cavernous and the supraclinoid segments are widely
patent.

The left middle cerebral artery proximally demonstrates
approximately 75-80% severe stenosis associated with another 50%
tandem narrowing just distal to this but proximal to the left middle
cerebral artery bifurcation branches.

The left anterior cerebral artery is seen to opacify into the
capillary and venous phases.

Again demonstrated are scattered focal areas of smooth narrowing
involving the left anterior cerebral artery branches. The left
vertebral artery origin is normal.

The vessel is seen to opacify to the cranial skull base.
Opacification of the left vertebrobasilar junction is seen to be
normal.

The left posterior inferior cerebellar artery though patent has
focal areas of mild caliber irregularity and narrowing. The basilar
artery, the posterior cerebral arteries, and the superior cerebellar
arteries opacify normally into the capillary and the venous phases.

Again demonstrated is a tight focal narrowing at the origin of the
right anterior inferior cerebellar artery also unchanged from the
last examination.

The left anterior inferior cerebral artery demonstrates focal areas
of caliber narrowing.

Non-opacified blood is seen in the basilar artery from the
contralateral vertebral artery.

The origin of the right subclavian artery is from the aortic arch as
the last branch coursing across the midline. The right vertebral
artery origin appears mildly narrowed. The vessel, otherwise,
opacifies normally to the cranial skull base. Normal opacification
is seen of the right posterior inferior cerebellar artery with the
opacified portions of the basilar artery appearing grossly patent.
IMPRESSION: 75-80% stenosis of the right middle cerebral artery proximally, with
a worsening 50% tandem narrowing of the a distal left M1 segment of
the left middle cerebral artery not seen on the previous
examination.

Stable 50% narrowing of the right middle cerebral artery distal M1
segment, and of the origin of the inferior division of the right
middle cerebral artery both of which are stable.

Scattered nonspecific intracranial smooth narrowing of the left
anterior cerebral artery branch and the left posterior inferior
cerebellar artery. These may represent intracranial arteriosclerotic
changes most likely. Less likely possibility of vasospasm changes
given the history of daily marijuana smoking by the patient.

The findings were reviewed with the patient and the patient's
mother.

Although there has been progression of disease in the left middle
cerebral artery as described above, there continues to be adequate
perfusion into the left middle cerebral artery distribution with the
patient being asymptomatic at this time. The patient has been asked
to continue taking his aspirin and Plavix in addition to tight
control of his diabetes and high blood pressure. Again he was warned
about the consequences of marijuana smoking on cerebrovascular. A
follow-up catheter angiogram will be undertaken in about 6 months
time from today. Should the patient become symptomatic, the patient
and his mother have been asked to call promptly.

## 2020-04-21 DEATH — deceased
